# Patient Record
Sex: Female | Born: 1965 | Race: Black or African American | Hispanic: No | Marital: Single | Smoking: Never smoker
Health system: Southern US, Community
[De-identification: ages and names within clinical notes are randomized; demographics above are authoritative.]

## PROBLEM LIST (undated history)

## (undated) DIAGNOSIS — R011 Cardiac murmur, unspecified: Secondary | ICD-10-CM

## (undated) DIAGNOSIS — M549 Dorsalgia, unspecified: Secondary | ICD-10-CM

## (undated) DIAGNOSIS — G8929 Other chronic pain: Secondary | ICD-10-CM

---

## 2006-12-08 ENCOUNTER — Emergency Department (HOSPITAL_COMMUNITY): Admission: EM | Admit: 2006-12-08 | Discharge: 2006-12-08 | Payer: Self-pay | Admitting: Family Medicine

## 2007-01-02 ENCOUNTER — Emergency Department (HOSPITAL_COMMUNITY): Admission: EM | Admit: 2007-01-02 | Discharge: 2007-01-02 | Payer: Self-pay | Admitting: Emergency Medicine

## 2007-03-10 ENCOUNTER — Emergency Department (HOSPITAL_COMMUNITY): Admission: EM | Admit: 2007-03-10 | Discharge: 2007-03-10 | Payer: Self-pay | Admitting: Emergency Medicine

## 2007-03-18 ENCOUNTER — Encounter: Admission: RE | Admit: 2007-03-18 | Discharge: 2007-03-18 | Payer: Self-pay | Admitting: Family Medicine

## 2010-03-10 ENCOUNTER — Encounter: Payer: Self-pay | Admitting: Family Medicine

## 2010-11-26 LAB — POCT URINALYSIS DIP (DEVICE)
Glucose, UA: NEGATIVE
Nitrite: NEGATIVE
Protein, ur: NEGATIVE
Specific Gravity, Urine: 1.02
Urobilinogen, UA: 1
pH: 7

## 2010-11-26 LAB — I-STAT 8, (EC8 V) (CONVERTED LAB)
BUN: 10
Chloride: 104
Glucose, Bld: 128 — ABNORMAL HIGH
Potassium: 3.6
pCO2, Ven: 47.7
pH, Ven: 7.357 — ABNORMAL HIGH

## 2010-11-26 LAB — POCT I-STAT CREATININE
Creatinine, Ser: 0.8
Operator id: 239701

## 2013-06-17 ENCOUNTER — Emergency Department (INDEPENDENT_AMBULATORY_CARE_PROVIDER_SITE_OTHER)
Admission: EM | Admit: 2013-06-17 | Discharge: 2013-06-17 | Disposition: A | Discharge status: Home / Self Care | Payer: Commercial Managed Care - PPO | Source: Home / Self Care

## 2013-06-17 ENCOUNTER — Encounter (HOSPITAL_COMMUNITY): Payer: Self-pay | Admitting: Emergency Medicine

## 2013-06-17 DIAGNOSIS — M791 Myalgia, unspecified site: Secondary | ICD-10-CM

## 2013-06-17 DIAGNOSIS — G8929 Other chronic pain: Secondary | ICD-10-CM

## 2013-06-17 DIAGNOSIS — R52 Pain, unspecified: Secondary | ICD-10-CM

## 2013-06-17 DIAGNOSIS — IMO0001 Reserved for inherently not codable concepts without codable children: Secondary | ICD-10-CM

## 2013-06-17 DIAGNOSIS — T148XXA Other injury of unspecified body region, initial encounter: Secondary | ICD-10-CM

## 2013-06-17 DIAGNOSIS — M549 Dorsalgia, unspecified: Secondary | ICD-10-CM

## 2013-06-17 DIAGNOSIS — M609 Myositis, unspecified: Secondary | ICD-10-CM

## 2013-06-17 DIAGNOSIS — X503XXA Overexertion from repetitive movements, initial encounter: Secondary | ICD-10-CM

## 2013-06-17 HISTORY — DX: Other chronic pain: G89.29

## 2013-06-17 HISTORY — DX: Dorsalgia, unspecified: M54.9

## 2013-06-17 MED ORDER — CYCLOBENZAPRINE HCL 5 MG PO TABS: 5.0000 mg | ORAL_TABLET | Freq: Three times a day (TID) | ORAL | Status: DC | PRN

## 2013-06-17 MED ORDER — KETOROLAC TROMETHAMINE 60 MG/2ML IM SOLN
60.0000 mg | Freq: Once | INTRAMUSCULAR | Status: AC
  Administered 2013-06-17: 60 mg via INTRAMUSCULAR

## 2013-06-17 MED ORDER — TRAMADOL-ACETAMINOPHEN 37.5-325 MG PO TABS: 1.0000 | ORAL_TABLET | Freq: Four times a day (QID) | ORAL | Status: DC | PRN

## 2013-06-17 MED ORDER — MELOXICAM 15 MG PO TABS: 15.0000 mg | ORAL_TABLET | Freq: Every day | ORAL | Status: DC

## 2013-06-17 MED ORDER — KETOROLAC TROMETHAMINE 60 MG/2ML IM SOLN
INTRAMUSCULAR | Status: AC
  Filled 2013-06-17: qty 2

## 2013-06-17 NOTE — ED Notes (Signed)
Encouraged to find pcp

## 2013-06-17 NOTE — ED Notes (Signed)
Patient denies pain and is resting comfortably.  

## 2013-06-17 NOTE — ED Provider Notes (Signed)
CSN: [id number]     Arrival date & time 06/17/13  1145 History   First MD Initiated Contact with Patient 06/17/13 1234     Chief Complaint  Patient presents with  . Back Pain  . Extremity Pain   (Consider location/radiation/quality/duration/timing/severity/associated sxs/prior Treatment) HPI Comments: 48 year old female with chronic low back pain since 2009. 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 For the past 2 weeks she is experiencing worsening muscle pain in the shoulders and upper back as well as the parathoracic muscles. She is experiencing needle and sharp pains in the lower extremities. Her right arm feels tight. Knee the symptoms she has had in the past and was treated by her PCP with physical therapy and medication. She has also been given exercises to perform to help with her back   Past Medical History  Diagnosis Date  . Chronic back pain    History reviewed. No pertinent past surgical history. No family history on file. History  Substance Use Topics  . Smoking status: Never Smoker   . Smokeless tobacco: Not on file  . Alcohol Use: No   OB History   Grav Para Term Preterm Abortions TAB SAB Ect Mult Living                 Review of Systems  Constitutional: Negative for fever, chills and activity change.  HENT: Negative.   Respiratory: Negative.   Cardiovascular: Negative.   Musculoskeletal:       As per HPI  Skin: Negative for color change, pallor and rash.  Neurological: Negative.     Allergies  Reglan  Home Medications   Prior to Admission medications   Not on File   BP 111/68  Pulse 58  Temp(Src) 97.8 F (36.6 C) (Oral)  Resp 12  SpO2 100% Physical Exam  Nursing note and vitals reviewed. Constitutional: She is oriented to person, place, and time. She appears well-developed and well-nourished. No distress.  HENT:  Head: Normocephalic and atraumatic.  Eyes: EOM are normal. Pupils are equal, round, and reactive  to light.  Neck: Normal range of motion. Neck supple.  Cardiovascular: Normal rate and regular rhythm.   Pulmonary/Chest: Effort normal and breath sounds normal. No respiratory distress.  Abdominal: Soft. There is no tenderness.  Musculoskeletal: Normal range of motion. She exhibits no edema.  Tenderness to palpation of the left trapezius ridge, right paracervical musculature, bilateral para thoracic and lumbar musculature, bilateral deltoids and thigh musculature. Strength is 4 over 4 bilaterally she is able to ambulate with some discomfort in the thigh muscles.  Neurological: She is alert and oriented to person, place, and time. No cranial nerve deficit.  Skin: Skin is warm and dry.  Psychiatric: She has a normal mood and affect.    ED Course  Procedures (including critical care time) Labs Review Labs Reviewed - No data to display  Imaging Review No results found.   MDM   1. Myofasciitis   2. Myalgia   3. Repetitive use syndrome   4. Exacerbation of chronic back pain    Flexeril 5 mg ultracet as dir mobic 15mg  Back strethes and heat Obtain MD for chronic and acute problems     Hayden Rasmussenavid Tanner Yeley, NP 06/17/13 1326

## 2013-06-17 NOTE — ED Notes (Signed)
Patient has relocated to [place] from [place], has been in the area for a month.  Ran out of flexeril and hydrocodone about 2 weeks ago.  Patient has chronic back pain, new onset of bilateral arm and leg pain, no known injury

## 2013-06-17 NOTE — ED Notes (Signed)
Family member returned for work note

## 2013-06-17 NOTE — Discharge Instructions (Signed)
Back Pain, Adult Back pain is very common. The pain often gets better over time. The cause of back pain is usually not dangerous. Most people can learn to manage their back pain on their own.  HOME CARE   Stay active. Start with short walks on flat ground if you can. Try to walk farther each day.  Do not sit, drive, or stand in one place for more than 30 minutes. Do not stay in bed.  Do not avoid exercise or work. Activity can help your back heal faster.  Be careful when you bend or lift an object. Bend at your knees, keep the object close to you, and do not twist.  Sleep on a firm mattress. Lie on your side, and bend your knees. If you lie on your back, put a pillow under your knees.  Only take medicines as told by your doctor.  Put ice on the injured area.  Put ice in a plastic bag.  Place a towel between your skin and the bag.  Leave the ice on for 15-20 minutes, 03-04 times a day for the first 2 to 3 days. After that, you can switch between ice and heat packs.  Ask your doctor about back exercises or massage.  Avoid feeling anxious or stressed. Find good ways to deal with stress, such as exercise. GET HELP RIGHT AWAY IF:   Your pain does not go away with rest or medicine.  Your pain does not go away in 1 week.  You have new problems.  You do not feel well.  The pain spreads into your legs.  You cannot control when you poop (bowel movement) or pee (urinate).  Your arms or legs feel weak or lose feeling (numbness).  You feel sick to your stomach (nauseous) or throw up (vomit).  You have belly (abdominal) pain.  You feel like you may pass out (faint). MAKE SURE YOU:   Understand these instructions.  Will watch your condition.  Will get help right away if you are not doing well or get worse. Document Released: 07/22/2007 Document Revised: 04/27/2011 Document Reviewed: 06/23/2010 [facility]Exit[facility] Patient Information 2014 [place]ExitCare, [place].  Repetitive Strain  Injuries Repetitive strain injuries (RSIs) result from overuse or misuse of soft tissues including muscles, tendons, or nerves. Tendons are the cord-like structures that attach muscles to bones. RSIs can affect almost any part of the body. However, RSIs are most common in the arms (thumbs, wrists, elbows, shoulders) and legs (ankles, knees). Common medical conditions that are often caused by repetitive strain include carpal tunnel syndrome, tennis or golfer's elbow, bursitis, and tendonitis. If RSIs are treated early, and therepeated activity is reduced or removed, the severity and length of your problems can usually be reduced. RSIs are also called cumulative trauma disorders (CTD).  CAUSES  Many RSIs occur due to repeating the same activity at work over weeks or months without sufficient rest, such as prolonged typing. RSIs also commonly occur when a hobby or sport is done repeatedly without sufficient rest. RSIs can also occur due to repeated strain or stress on a body part in someone who has one or more risk factors for RSIs. RISK FACTORS Workplace risk factors  Frequent computer use, especially if your workstation is not adjusted for your body type.  Infrequent rest breaks.  Working in a high-pressure environment.  Working at a [employer]fast pace.  Repeating the same motion, such as frequent typing.  Working in an awkward position or holding the same position for a  long time.  Forceful movements such as lifting, pulling, or pushing.  Vibration caused by using power tools.  Working in cold temperatures.  Job stress. Personal risk factors  Poor posture.  Being loose-jointed.  Not exercising regularly.  Being overweight.  Arthritis, diabetes, thyroid problems, or other long-term (chronic)medical conditions.  Vitamin deficiencies.  Keeping your fingernails long.  An unhealthy, stressful, or inactive lifestyle.  Not sleeping well. SYMPTOMS  Symptoms often begin at work but  become more noticeable after the repeated stress has ended. For example, you may develop fatigue or soreness in your wrist while typingat work, and at night you may develop numbness and tingling in your fingers. Common symptoms include:   Burning, shooting, or aching pain, especially in the fingers, palms, wrists, forearms, or shoulders.  Tenderness.  Swelling.  Tingling, numbness, or loss of feeling.  Pain with certain activities, such as turning a doorknob or reaching above your head.  Weakness, heaviness, or loss of coordination in yourhand.  Muscle spasms or tightness. In some cases, symptoms can become so intense that it is difficult to perform everyday tasks. Symptoms that do not improve with rest may indicate a more serious condition.  DIAGNOSIS  Your caregiver may determine the type ofRSI you have based on your medical evaluation and a description of your activities.  TREATMENT  Treatment depends on the severity and type of RSI you have. Your caregiver may recommend rest for the affected body part, medicines, and physical or occupational therapy to reduce pain, swelling, and soreness. Discuss the activities you do repeatedly with your caregiver. Your caregiver can help you decide whether you need to change your activities. An RSI may take months or years to heal, especially if the affected body part gets insufficient rest. In some cases, such as severe carpal tunnel syndrome, surgery may be recommended. PREVENTION  Talk with your supervisor to make sure you have the proper equipment for your work station.  Maintain good posture at your desk or work station with:  Feet flat on the floor.  Knees directly over the feet, bent at a right angle.  Lower back supported by your chair or a cushion in the curve of your lower back.  Shoulders and arms relaxed and at your sides.  Neck relaxed and not bent forwards or backwards.  Your desk and computer workstation properly adjusted  to your body type.  Your chair adjusted so there is no excess pressure on the back of your thighs.  The keyboard resting above your thighs. You should be able to reach the keys with your elbows at your side, bent at a right angle. Your arms should be supported on forearm rests, with your forearms parallel to the ground.  The computer mouse within easy reach.  The monitor directly in front of you, so that your eyes are aligned with the top of the screen. The screen should be about 15 to 25 inches from your eyes.  While typing, keep your wrist straight, in a neutral position. Move your entire arm when you move your mouse or when typing hard-to-reach keys.  Only use your computer as much as you need to for work. Do not use it during breaks.  Take breaks often from any repeated activity. Alternate with another task which requires you to use different muscles, or rest at least once every hour.  Change positions regularly. If you spend a lot of time sitting, get up, walk around, and stretch.  Do not hold pens or pencils  tightly when writing.  Exercise regularly.  Maintain a normal weight.  Eat a diet with plenty of vegetables, whole grains, and fruit.  Get sufficient, restful sleep. HOME CARE INSTRUCTIONS  If your caregiver prescribed medicine to help reduce swelling, take it as directed.  Only take over-the-counter or prescription medicines for pain, discomfort, or fever as directed by your caregiver.  Reduce, and if needed, stopthe activities that are causing your problems until you have no further symptoms.If your symptoms are work-related, you may need to talk to your supervisor about changing your activities.  When symptoms develop, put ice or a cold pack on the aching area.  Put ice in a plastic bag.  Place a towel between your skin and the bag.  Leave the ice on for 15-20 minutes.  If you were given a splint to keep your wrist from bending, wear it as instructed. It is  important to wear the splint at night. Use the splint for as long as your caregiver recommends. SEEK MEDICAL CARE IF:  You develop new problems.  Your problems do not get better with medicine. MAKE SURE YOU:  Understand these instructions.  Will watch your condition.  Will get help right away if you are not doing well or get worse. Document Released: 01/23/2002 Document Revised: 08/04/2011 Document Reviewed: 03/26/2011 [facility]Exit[facility] Patient Information 2014 [place]ExitCare, [place]LLC.  Myofascial Pain Syndrome  Myofascial pain (MP) is one of the most common causes of discomfort. This pain may be felt in the muscles. It may come and go. MP always has trigger or tender points in the muscle that will cause pain when pressed. HOME CARE   Learn to do gentle exercise. Stretch.  Change your position every hour.  Eat a healthy diet.  Find ways to relax and lessen stress.  Avoid things that make your pain worse.  Get a massage by someone trained to release trigger or tender points.  Only take medicine as told by your doctor.  Get follow-up care as needed. GET HELP RIGHT AWAY IF:   Your pain suddenly gets worse.  You are not able to move your arms or legs.  You have pain in your chest.  It is hard to breathe. MAKE SURE YOU:  Understand these instructions.  Will watch your condition.  Will get help right away if you are not doing well or get worse. Document Released: 01/16/2008 Document Revised: 04/27/2011 Document Reviewed: 01/16/2008 [facility]Exit[facility] Patient Information 2014 [place]ExitCare, [place]LLC.

## 2013-06-20 NOTE — ED Provider Notes (Signed)
Medical screening examination/treatment/procedure(s) were performed by resident physician or non-physician practitioner and as supervising physician I was immediately available for consultation/collaboration.   Adelyne Marchese DOUGLAS MD.   Tevon Berhane D Jaimi Belle, MD 06/20/13 0809 

## 2013-06-28 ENCOUNTER — Encounter (HOSPITAL_COMMUNITY): Payer: Self-pay | Admitting: Emergency Medicine

## 2013-06-28 ENCOUNTER — Emergency Department (HOSPITAL_COMMUNITY): Payer: Commercial Managed Care - PPO

## 2013-06-28 ENCOUNTER — Emergency Department (HOSPITAL_COMMUNITY)
Admission: EM | Admit: 2013-06-28 | Discharge: 2013-06-29 | Disposition: A | Discharge status: Home / Self Care | Payer: Commercial Managed Care - PPO | Attending: Emergency Medicine | Admitting: Emergency Medicine

## 2013-06-28 DIAGNOSIS — M25569 Pain in unspecified knee: Secondary | ICD-10-CM | POA: Insufficient documentation

## 2013-06-28 DIAGNOSIS — G8929 Other chronic pain: Secondary | ICD-10-CM | POA: Insufficient documentation

## 2013-06-28 DIAGNOSIS — M25562 Pain in left knee: Secondary | ICD-10-CM

## 2013-06-28 DIAGNOSIS — R011 Cardiac murmur, unspecified: Secondary | ICD-10-CM | POA: Insufficient documentation

## 2013-06-28 HISTORY — DX: Cardiac murmur, unspecified: R01.1

## 2013-06-28 MED ORDER — HYDROCODONE-ACETAMINOPHEN 5-325 MG PO TABS: ORAL_TABLET | ORAL | Status: DC

## 2013-06-28 MED ORDER — HYDROCODONE-ACETAMINOPHEN 5-325 MG PO TABS
2.0000 | ORAL_TABLET | Freq: Once | ORAL | Status: AC
  Administered 2013-06-28: 2 via ORAL
  Filled 2013-06-28: qty 2

## 2013-06-28 NOTE — ED Provider Notes (Signed)
CSN: [id number]     Arrival date & time 06/28/13  2106 History   First MD Initiated Contact with Patient 06/28/13 2205     Chief Complaint  Patient presents with  . Knee Pain     (Consider location/radiation/quality/duration/timing/severity/associated sxs/prior Treatment) HPI  Tara Roach is a 48 y.o. female complaining of bilateral knee pain, significantly worse on the left over the course of 2 weeks. Patient has taken over-the-counter pain medication with little relief. This is atraumatic however she states that when she weight-bear she did feel a popping sensation in the left knee she feels as if the bones are moving. 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 Patient denies numbness, weakness, paresthesia, calf pain, fever.   Past Medical History  Diagnosis Date  . Chronic back pain   . Heart murmur    Past Surgical History  Procedure Laterality Date  . Cesarean section     Family History  Problem Relation Age of Onset  . Cancer Father   . Hypertension Sister   . Diabetes Brother    History  Substance Use Topics  . Smoking status: Never Smoker   . Smokeless tobacco: Not on file  . Alcohol Use: No   OB History   Grav Para Term Preterm Abortions TAB SAB Ect Mult Living                 Review of Systems  10 systems reviewed and found to be negative, except as noted in the HPI.   Allergies  Reglan  Home Medications   Prior to Admission medications   Medication Sig Start Date End Date Taking? Authorizing Provider  naproxen sodium (ANAPROX) 220 MG tablet Take 440 mg by mouth every 6 (six) hours as needed (pain).   Yes Historical Provider, MD  traMADol-acetaminophen (ULTRACET) 37.5-325 MG per tablet Take 1-2 tablets by mouth every 6 (six) hours as needed. 06/17/13  Yes Hayden Rasmussenavid Mabe, NP   BP 131/73  Pulse 52  Temp(Src) 98 F (36.7 C) (Oral)  Resp 20  Ht 5\' 2"  (1.575 m)  Wt 178 lb (80.74 kg)  BMI 32.55 kg/m2  SpO2 100%   LMP 06/11/2013 Physical Exam  Nursing note and vitals reviewed. Constitutional: She is oriented to person, place, and time. She appears well-developed and well-nourished. No distress.  HENT:  Head: Normocephalic.  Eyes: Conjunctivae and EOM are normal.  Cardiovascular: Normal rate.   Pulmonary/Chest: Effort normal and breath sounds normal. No stridor. No respiratory distress. She has no wheezes. She has no rales. She exhibits no tenderness.  Abdominal: Soft. Bowel sounds are normal. She exhibits no distension and no mass. There is no tenderness. There is no rebound and no guarding.  Musculoskeletal: Normal range of motion. She exhibits no edema and no tenderness.  Bilateral knees:  No deformity, erythema or abrasions. FROM. No effusion or crepitance. Anterior and posterior drawer show no abnormal laxity. Stable to valgus and varus stress. Joint lines are non-tender. Neurovascularly intact. Pt ambulates with mildly antalgic gait.    Neurological: She is alert and oriented to person, place, and time.  Psychiatric: She has a normal mood and affect.    ED Course  Procedures (including critical care time) Labs Review Labs Reviewed - No data to display  Imaging Review Dg Knee Complete 4 Views Left  06/28/2013   CLINICAL DATA:  Pain.  No known injury.  EXAM: LEFT KNEE - COMPLETE 4+ VIEW  COMPARISON:  None.  FINDINGS: No acute fracture or dislocation. Small joint effusion present within the suprapatellar recess. Joint spaces are well maintained. No significant degenerative changes seen about the knee. Osseous mineralization is normal. No soft tissue abnormality.  IMPRESSION: 1. Small joint effusion within the suprapatellar recess. 2. No acute fracture or dislocation.   Electronically Signed   By: Rise MuBenjamin  McClintock M.D.   On: 06/28/2013 22:56     EKG Interpretation None      MDM   Final diagnoses:  Arthralgia of left knee    Filed Vitals:   06/28/13 2159 06/28/13 2347  BP:  131/58 131/73  Pulse: 52   Temp: 98 F (36.7 C) 98 F (36.7 C)  TempSrc: Oral Oral  Resp: 20 20  Height: 5\' 2"  (1.575 m)   Weight: 178 lb (80.74 kg)   SpO2: 99% 100%    Medications  HYDROcodone-acetaminophen (NORCO/VICODIN) 5-325 MG per tablet 2 tablet (2 tablets Oral Given 06/28/13 2345)    Tara Roach is a 48 y.o. female presenting with bilateral knee pain, worse on the left than the right. This is atraumatic, she feels that the left knee is unstable. Physical exam shows no abnormal laxity. X-ray negative. I will place the patient in a knee immobilizer, give her pain control and encourage rest. I have advised her that it is very important that she establish primary care. Given her wellness center referral.  Evaluation does not show pathology that would require ongoing emergent intervention or inpatient treatment. Pt is hemodynamically stable and mentating appropriately. Discussed findings and plan with patient/guardian, who agrees with care plan. All questions answered. Return precautions discussed and outpatient follow up given.   Discharge Medication List as of 06/28/2013 11:12 PM    START taking these medications   Details  HYDROcodone-acetaminophen (NORCO/VICODIN) 5-325 MG per tablet Take 1-2 tablets by mouth every 6 hours as needed for pain., Print        Note: Portions of this report may have been transcribed using voice recognition software. Every effort was made to ensure accuracy; however, inadvertent computerized transcription errors may be present     Wynetta Emeryicole Julie Nay, PA-C 06/29/13 0139

## 2013-06-28 NOTE — Discharge Instructions (Signed)
Rest, Ice intermittently (in the first 24-48 hours), Gentle compression with an Ace wrap, and elevate (Limb above the level of the heart)   Take up to 800mg  of ibuprofen (that is usually 4 over the counter pills)  3 times a day for 5 days. Take with food.  Take vicodin for breakthrough pain, do not drink alcohol, drive, care for children or do other critical tasks while taking vicodin.   Do not hesitate to return to the Emergency Department for any new, worsening or concerning symptoms.   If you do not have a primary care doctor you can establish one at the   [facility] [address] [place] [id number] [phone]  After you establish care. Let them know you were seen in the emergency room. They must obtain records for further management.    Knee Pain Knee pain can be a result of an injury or other medical conditions. Treatment will depend on the cause of your pain. HOME CARE  Only take medicine as told by your doctor.  Keep a healthy weight. Being overweight can make the knee hurt more.  Stretch before exercising or playing sports.  If there is constant knee pain, change the way you exercise. Ask your doctor for advice.  Make sure shoes fit well. Choose the right shoe for the sport or activity.  Protect your knees. Wear kneepads if needed.  Rest when you are tired. GET HELP RIGHT AWAY IF:   Your knee pain does not stop.  Your knee pain does not get better.  Your knee joint feels hot to the touch.  You have a fever. MAKE SURE YOU:   Understand these instructions.  Will watch this condition.  Will get help right away if you are not doing well or get worse. Document Released: 05/01/2008 Document Revised: 04/27/2011 Document Reviewed: 05/01/2008 [facility]Exit[facility] Patient Information 2014 [place]ExitCare, [place]LL[place].

## 2013-06-28 NOTE — ED Notes (Signed)
Pt is c/o bilateral knee pain and pain in her left leg that radiates straight down to her foot and up to her hip   Pt states she has been hurting for 2 weeks  Pt states her left knee keeps "popping"

## 2013-07-01 NOTE — ED Provider Notes (Signed)
Medical screening examination/treatment/procedure(s) were performed by non-physician practitioner and as supervising physician I was immediately available for consultation/collaboration.    Signa Cheek R Jameya Pontiff, MD 07/01/13 0710 

## 2013-08-05 ENCOUNTER — Encounter (HOSPITAL_COMMUNITY): Payer: Self-pay | Admitting: Emergency Medicine

## 2013-08-05 ENCOUNTER — Emergency Department (INDEPENDENT_AMBULATORY_CARE_PROVIDER_SITE_OTHER)
Admission: EM | Admit: 2013-08-05 | Discharge: 2013-08-05 | Disposition: A | Discharge status: Home / Self Care | Payer: Commercial Managed Care - PPO | Source: Home / Self Care | Attending: Family Medicine | Admitting: Family Medicine

## 2013-08-05 DIAGNOSIS — S43499A Other sprain of unspecified shoulder joint, initial encounter: Secondary | ICD-10-CM

## 2013-08-05 DIAGNOSIS — G43009 Migraine without aura, not intractable, without status migrainosus: Secondary | ICD-10-CM

## 2013-08-05 DIAGNOSIS — S46812A Strain of other muscles, fascia and tendons at shoulder and upper arm level, left arm, initial encounter: Secondary | ICD-10-CM

## 2013-08-05 DIAGNOSIS — X58XXXA Exposure to other specified factors, initial encounter: Secondary | ICD-10-CM

## 2013-08-05 DIAGNOSIS — S46819A Strain of other muscles, fascia and tendons at shoulder and upper arm level, unspecified arm, initial encounter: Secondary | ICD-10-CM

## 2013-08-05 LAB — POCT PREGNANCY, URINE: PREG TEST UR: NEGATIVE

## 2013-08-05 MED ORDER — DEXAMETHASONE SODIUM PHOSPHATE 10 MG/ML IJ SOLN
10.0000 mg | Freq: Once | INTRAMUSCULAR | Status: AC
  Administered 2013-08-05: 10 mg via INTRAMUSCULAR

## 2013-08-05 MED ORDER — DEXAMETHASONE SODIUM PHOSPHATE 10 MG/ML IJ SOLN
INTRAMUSCULAR | Status: AC
  Filled 2013-08-05: qty 1

## 2013-08-05 MED ORDER — KETOROLAC TROMETHAMINE 60 MG/2ML IM SOLN
INTRAMUSCULAR | Status: AC
  Filled 2013-08-05: qty 2

## 2013-08-05 MED ORDER — KETOROLAC TROMETHAMINE 60 MG/2ML IM SOLN
60.0000 mg | Freq: Once | INTRAMUSCULAR | Status: AC
  Administered 2013-08-05: 60 mg via INTRAMUSCULAR

## 2013-08-05 NOTE — Discharge Instructions (Signed)
Thank you for coming in today. Continue Tylenol ibuprofen or Aleve for headache.  Attend physical therapy for shoulder pain. You should be contacted by physical therapy soon. If you do not hear from them you can call [address] [place], [place] [phone] Please followup with a primary care Dr. Nyra CapesGo to the emergency room if your headache becomes excruciating or you have weakness or numbness or uncontrolled vomiting.  Come back or go to the emergency room if you notice new weakness new numbness problems walking or bowel or bladder problems.  PRIMARY CARE Merchant navy officerDOCTORS [facility] [address]  [place], [place] [phone]  Fax [phone]  Nature conservation [facility] at [facility] [address]  [place], [place] [phone]  Fax [phone]  Nature conservation officer[facility] at [place] / [place] [phone]  [place], [place] [phone]  Fax [phone]  [facility] [address], [address]  [place], [place] [phone]  Fax [phone]  [facility] [address] [place] [address]  [place], [place] [phone]  Fax [phone]  [facility] [address] [place]  [place], [place] [phone]  Fax [phone]   [facility] [place] [address] [place] [address] Phone: [phone]   [facility] @ [facility] [address] [place] [address] Phone: [phone]   [facility] @ [place] [address] [place] [address] Phone: [phone]   [facility] @ [place] [address] [place] [address] Phone: [phone]   [facility] @ [place] [address] [place] [address] Phone: [phone] Fax: [phone]   [facility] @ [place]  [place] [address] [place] [address] Phone: [phone]     Migraine Headache A migraine headache is an intense, throbbing pain on one or both sides of your head. A migraine can last for 30 minutes to several hours. CAUSES  The exact cause of a migraine headache is not always known. However, a migraine may be caused when nerves in the brain become irritated and release chemicals that cause inflammation. This causes pain. Certain things may also trigger migraines, such as:  Alcohol.  Smoking.  Stress.  Menstruation.  Aged cheeses.  Foods or drinks that contain nitrates, glutamate, aspartame, or tyramine.  Lack of sleep.  Chocolate.  Caffeine.  Hunger.  Physical exertion.  Fatigue.  Medicines used to treat chest pain (nitroglycerine), birth control pills, estrogen, and some blood pressure medicines. SIGNS AND SYMPTOMS  Pain on one or both sides of your head.  Pulsating or throbbing pain.  Severe pain that prevents daily activities.  Pain that is aggravated by any physical activity.  Nausea, vomiting, or both.  Dizziness.  Pain with exposure to bright lights, loud noises, or activity.  General sensitivity to bright lights, loud noises, or smells. Before you get a migraine, you may get warning signs that a migraine is coming (aura). An aura may include:  Seeing flashing lights.  Seeing bright spots, halos, or zig-zag lines.  Having tunnel vision or blurred vision.  Having feelings of numbness or tingling.  Having trouble talking.  Having muscle weakness. DIAGNOSIS  A migraine headache is often diagnosed based on:  Symptoms.  Physical exam.  A  CT scan or MRI of your head. These imaging tests cannot diagnose migraines, but they can help rule out other causes of headaches. TREATMENT Medicines may be given for pain and nausea. Medicines can also be given to help prevent recurrent migraines.  HOME CARE INSTRUCTIONS  Only take  over-the-counter or prescription medicines for pain or discomfort as directed by your health care provider. The use of long-term narcotics is not recommended.  Lie down in a dark, quiet room when you have a migraine.  Keep a journal to find out what may trigger your migraine headaches. For example, write down:  What you eat and drink.  How much sleep you get.  Any change to your diet or medicines.  Limit alcohol consumption.  Quit smoking if you smoke.  Get 7-9 hours of sleep, or as recommended by your health care provider.  Limit stress.  Keep lights dim if bright lights bother you and make your migraines worse. SEEK IMMEDIATE MEDICAL CARE IF:   Your migraine becomes severe.  You have a fever.  You have a stiff neck.  You have vision loss.  You have muscular weakness or loss of muscle control.  You start losing your balance or have trouble walking.  You feel faint or pass out.  You have severe symptoms that are different from your first symptoms. MAKE SURE YOU:   Understand these instructions.  Will watch your condition.  Will get help right away if you are not doing well or get worse. Document Released: 02/02/2005 Document Revised: 11/23/2012 Document Reviewed: 10/10/2012 [facility] Patient Information 2015 [employer], [place]. This information is not intended to replace advice given to you by your health care provider. Make sure you discuss any questions you have with your health care provider.

## 2013-08-05 NOTE — ED Notes (Signed)
Patient complains of headache with upper back pain and eye pain around left eye. States some blurry vision and nausea.

## 2013-08-05 NOTE — ED Provider Notes (Signed)
Tara Roach is a 48 y.o. female who presents to Urgent Care today for headache and trapezius pain.  1) patient has developed a headache for the last 4 days. She describes a pounding left-sided headache associated with photophobia and mild occasional blurry vision. She additionally notes nausea. She feels fine currently aside from mild headache. She's tried Tylenol which helps some. She denies any fevers or chills nausea vomiting or diarrhea. She notes that she has a history of migraines and her headache is consistent with prior episodes of migraines. She denies any weakness or numbness dizziness lightheadedness or loss of function.  2) trapezius pain patient notes pain in the left trapezius. This is been ongoing for several months. 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 She denies any significant radiating pain weakness or numbness. The pain is worse with activity and better with rest. She notes Tylenol has helped a little. She feels well otherwise.   Past Medical History  Diagnosis Date  . Chronic back pain   . Heart murmur    History  Substance Use Topics  . Smoking status: Never Smoker   . Smokeless tobacco: Not on file  . Alcohol Use: No   ROS as above Medications: No current facility-administered medications for this encounter.   No current outpatient prescriptions on file.    Exam:  BP 115/74  Pulse 60  Temp(Src) 98.4 F (36.9 C) (Oral)  Resp 16  SpO2 100%  LMP 07/15/2013 Gen: Well NAD HEENT: EOMI,  MMM. PERRLA bilateral. Funduscopic exam is normal bilaterally. Visual acuity shows 20/20 of the right eye in the left eye and 20/15 bilaterally Lungs: Normal work of breathing. CTABL Heart: RRR no MRG Abd: NABS, Soft. NT, ND Exts: Brisk capillary refill, warm and well perfused.  Neuro: Alert and oriented. Cranial nerves 2-12 are intact. Strength is intact throughout. Sensation is intact throughout. Reflexes are intact bilateral upper and lower  extremities. Normal balance gait and coordination.  Patient was given a intramuscular injection of Toradol and dexamethasone prior to discharge.   Results for orders placed during the hospital encounter of 08/05/13 (from the past 24 hour(s))  POCT PREGNANCY, URINE     Status: None   Collection Time    08/05/13  9:52 AM      Result Value Ref Range   Preg Test, Ur NEGATIVE  NEGATIVE   No results found.  Assessment and Plan: 48 y.o. female with  1) headache. Appears to be migraine type. Treatment with IM Toradol and dexamethasone. 2) trapezius pain: Chronic. Refer to physical therapy. Followup with primary care Dr.  Discussed warning signs or symptoms. Please see discharge instructions. Patient expresses understanding.    Rodolph BongEvan S Lynzee Lindquist, MD 08/05/13 1048

## 2013-10-17 ENCOUNTER — Ambulatory Visit: Payer: Commercial Managed Care - PPO | Admitting: Physical Therapy

## 2013-10-17 ENCOUNTER — Ambulatory Visit: Payer: Commercial Managed Care - PPO | Attending: Orthopaedic Surgery | Admitting: Physical Therapy

## 2013-10-17 DIAGNOSIS — M542 Cervicalgia: Secondary | ICD-10-CM | POA: Diagnosis not present

## 2013-10-17 DIAGNOSIS — IMO0001 Reserved for inherently not codable concepts without codable children: Secondary | ICD-10-CM | POA: Insufficient documentation

## 2013-10-17 DIAGNOSIS — M25569 Pain in unspecified knee: Secondary | ICD-10-CM | POA: Insufficient documentation

## 2013-10-19 ENCOUNTER — Other Ambulatory Visit: Payer: Self-pay | Admitting: Orthopaedic Surgery

## 2013-10-19 DIAGNOSIS — S83241S Other tear of medial meniscus, current injury, right knee, sequela: Secondary | ICD-10-CM

## 2013-10-30 ENCOUNTER — Encounter: Payer: Commercial Managed Care - PPO | Admitting: Rehabilitation

## 2013-11-02 ENCOUNTER — Encounter: Payer: Commercial Managed Care - PPO | Admitting: Rehabilitation

## 2013-11-04 ENCOUNTER — Ambulatory Visit
Admission: RE | Admit: 2013-11-04 | Discharge: 2013-11-04 | Disposition: A | Discharge status: Home / Self Care | Payer: Commercial Managed Care - PPO | Source: Ambulatory Visit | Attending: Orthopaedic Surgery | Admitting: Orthopaedic Surgery

## 2013-11-04 ENCOUNTER — Encounter (INDEPENDENT_AMBULATORY_CARE_PROVIDER_SITE_OTHER): Payer: Self-pay

## 2013-11-04 DIAGNOSIS — S83241S Other tear of medial meniscus, current injury, right knee, sequela: Secondary | ICD-10-CM

## 2013-11-06 ENCOUNTER — Encounter: Payer: Commercial Managed Care - PPO | Admitting: Rehabilitation

## 2013-11-09 ENCOUNTER — Encounter: Payer: Commercial Managed Care - PPO | Admitting: Rehabilitation

## 2013-11-26 ENCOUNTER — Encounter (HOSPITAL_COMMUNITY): Payer: Self-pay | Admitting: Emergency Medicine

## 2013-11-26 ENCOUNTER — Emergency Department (HOSPITAL_COMMUNITY)
Admission: EM | Admit: 2013-11-26 | Discharge: 2013-11-26 | Disposition: A | Discharge status: Home / Self Care | Payer: Commercial Managed Care - PPO | Attending: Emergency Medicine | Admitting: Emergency Medicine

## 2013-11-26 DIAGNOSIS — Z3202 Encounter for pregnancy test, result negative: Secondary | ICD-10-CM | POA: Insufficient documentation

## 2013-11-26 DIAGNOSIS — R51 Headache: Secondary | ICD-10-CM | POA: Diagnosis not present

## 2013-11-26 DIAGNOSIS — R011 Cardiac murmur, unspecified: Secondary | ICD-10-CM | POA: Insufficient documentation

## 2013-11-26 DIAGNOSIS — G8929 Other chronic pain: Secondary | ICD-10-CM | POA: Insufficient documentation

## 2013-11-26 DIAGNOSIS — R112 Nausea with vomiting, unspecified: Secondary | ICD-10-CM | POA: Insufficient documentation

## 2013-11-26 DIAGNOSIS — R42 Dizziness and giddiness: Secondary | ICD-10-CM | POA: Diagnosis present

## 2013-11-26 DIAGNOSIS — R519 Headache, unspecified: Secondary | ICD-10-CM

## 2013-11-26 LAB — COMPREHENSIVE METABOLIC PANEL
ALT: 14 U/L (ref 0–35)
ANION GAP: 13 (ref 5–15)
AST: 24 U/L (ref 0–37)
Albumin: 3.9 g/dL (ref 3.5–5.2)
Alkaline Phosphatase: 56 U/L (ref 39–117)
BUN: 10 mg/dL (ref 6–23)
CO2: 21 meq/L (ref 19–32)
CREATININE: 0.58 mg/dL (ref 0.50–1.10)
Calcium: 9.3 mg/dL (ref 8.4–10.5)
Chloride: 104 mEq/L (ref 96–112)
Glucose, Bld: 105 mg/dL — ABNORMAL HIGH (ref 70–99)
Potassium: 5.4 mEq/L — ABNORMAL HIGH (ref 3.7–5.3)
Sodium: 138 mEq/L (ref 137–147)
Total Bilirubin: 0.4 mg/dL (ref 0.3–1.2)
Total Protein: 8 g/dL (ref 6.0–8.3)

## 2013-11-26 LAB — URINE MICROSCOPIC-ADD ON

## 2013-11-26 LAB — CBC WITH DIFFERENTIAL/PLATELET
Basophils Absolute: 0 10*3/uL (ref 0.0–0.1)
Basophils Relative: 0 % (ref 0–1)
EOS PCT: 1 % (ref 0–5)
Eosinophils Absolute: 0.1 10*3/uL (ref 0.0–0.7)
HEMATOCRIT: 44.3 % (ref 36.0–46.0)
Hemoglobin: 14.9 g/dL (ref 12.0–15.0)
LYMPHS PCT: 34 % (ref 12–46)
Lymphs Abs: 2.8 10*3/uL (ref 0.7–4.0)
MCH: 30 pg (ref 26.0–34.0)
MCHC: 33.6 g/dL (ref 30.0–36.0)
MCV: 89.1 fL (ref 78.0–100.0)
MONO ABS: 0.7 10*3/uL (ref 0.1–1.0)
MONOS PCT: 8 % (ref 3–12)
NEUTROS ABS: 4.7 10*3/uL (ref 1.7–7.7)
Neutrophils Relative %: 57 % (ref 43–77)
Platelets: 256 10*3/uL (ref 150–400)
RBC: 4.97 MIL/uL (ref 3.87–5.11)
RDW: 13.8 % (ref 11.5–15.5)
WBC: 8.3 10*3/uL (ref 4.0–10.5)

## 2013-11-26 LAB — URINALYSIS, ROUTINE W REFLEX MICROSCOPIC
Bilirubin Urine: NEGATIVE
GLUCOSE, UA: NEGATIVE mg/dL
KETONES UR: NEGATIVE mg/dL
Nitrite: NEGATIVE
PROTEIN: NEGATIVE mg/dL
Specific Gravity, Urine: 1.011 (ref 1.005–1.030)
UROBILINOGEN UA: 1 mg/dL (ref 0.0–1.0)
pH: 6.5 (ref 5.0–8.0)

## 2013-11-26 LAB — I-STAT TROPONIN, ED: Troponin i, poc: 0 ng/mL (ref 0.00–0.08)

## 2013-11-26 LAB — PREGNANCY, URINE: Preg Test, Ur: NEGATIVE

## 2013-11-26 MED ORDER — PROCHLORPERAZINE EDISYLATE 5 MG/ML IJ SOLN
10.0000 mg | Freq: Once | INTRAMUSCULAR | Status: AC
  Administered 2013-11-26: 10 mg via INTRAVENOUS
  Filled 2013-11-26: qty 2

## 2013-11-26 MED ORDER — DIPHENHYDRAMINE HCL 50 MG/ML IJ SOLN
25.0000 mg | Freq: Once | INTRAMUSCULAR | Status: AC
  Administered 2013-11-26: 25 mg via INTRAVENOUS
  Filled 2013-11-26: qty 1

## 2013-11-26 MED ORDER — SODIUM CHLORIDE 0.9 % IV BOLUS (SEPSIS)
1000.0000 mL | Freq: Once | INTRAVENOUS | Status: AC
  Administered 2013-11-26: 1000 mL via INTRAVENOUS

## 2013-11-26 MED ORDER — KETOROLAC TROMETHAMINE 30 MG/ML IJ SOLN
30.0000 mg | Freq: Once | INTRAMUSCULAR | Status: AC
  Administered 2013-11-26: 30 mg via INTRAVENOUS
  Filled 2013-11-26: qty 1

## 2013-11-26 NOTE — ED Notes (Signed)
Pt states she has been having upper back and neck spasm since Friday.  Pt states she has also been dizzy and has been nauseated.  Unknown for fever.  Has been going to work and not feeling well during the day.

## 2013-11-26 NOTE — ED Notes (Signed)
Patient refused to stand up to finish orthostatic vital signs, patient also refused to provide a urine sample. Patient stated she "just wanted to relax".

## 2013-11-26 NOTE — Discharge Instructions (Signed)
General Headache Without Cause °A headache is pain or discomfort felt around the head or neck area. The specific cause of a headache may not be found. There are many causes and types of headaches. A few common ones are: °· Tension headaches. °· Migraine headaches. °· Cluster headaches. °· Chronic daily headaches. °HOME CARE INSTRUCTIONS  °· Keep all follow-up appointments with your caregiver or any specialist referral. °· Only take over-the-counter or prescription medicines for pain or discomfort as directed by your caregiver. °· Lie down in a dark, quiet room when you have a headache. °· Keep a headache journal to find out what may trigger your migraine headaches. For example, write down: °· What you eat and drink. °· How much sleep you get. °· Any change to your diet or medicines. °· Try massage or other relaxation techniques. °· Put ice packs or heat on the head and neck. Use these 3 to 4 times per day for 15 to 20 minutes each time, or as needed. °· Limit stress. °· Sit up straight, and do not tense your muscles. °· Quit smoking if you smoke. °· Limit alcohol use. °· Decrease the amount of caffeine you drink, or stop drinking caffeine. °· Eat and sleep on a regular schedule. °· Get 7 to 9 hours of sleep, or as recommended by your caregiver. °· Keep lights dim if bright lights bother you and make your headaches worse. °SEEK MEDICAL CARE IF:  °· You have problems with the medicines you were prescribed. °· Your medicines are not working. °· You have a change from the usual headache. °· You have nausea or vomiting. °SEEK IMMEDIATE MEDICAL CARE IF:  °· Your headache becomes severe. °· You have a fever. °· You have a stiff neck. °· You have loss of vision. °· You have muscular weakness or loss of muscle control. °· You start losing your balance or have trouble walking. °· You feel faint or pass out. °· You have severe symptoms that are different from your first symptoms. °MAKE SURE YOU:  °· Understand these  instructions. °· Will watch your condition. °· Will get help right away if you are not doing well or get worse. °Document Released: 02/02/2005 Document Revised: 04/27/2011 Document Reviewed: 02/18/2011 °ExitCare® Patient Information ©2015 [employer]. This information is not intended to replace advice given to you by your health care provider. Make sure you discuss any questions you have with your health care provider. ° °Migraine Headache °A migraine headache is an intense, throbbing pain on one or both sides of your head. A migraine can last for 30 minutes to several hours. °CAUSES  °The exact cause of a migraine headache is not always known. However, a migraine may be caused when nerves in the brain become irritated and release chemicals that cause inflammation. This causes pain. °Certain things may also trigger migraines, such as: °· Alcohol. °· Smoking. °· Stress. °· Menstruation. °· Aged cheeses. °· Foods or drinks that contain nitrates, glutamate, aspartame, or tyramine. °· Lack of sleep. °· Chocolate. °· Caffeine. °· Hunger. °· Physical exertion. °· Fatigue. °· Medicines used to treat chest pain (nitroglycerine), birth control pills, estrogen, and some blood pressure medicines. °SIGNS AND SYMPTOMS °· Pain on one or both sides of your head. °· Pulsating or throbbing pain. °· Severe pain that prevents daily activities. °· Pain that is aggravated by any physical activity. °· Nausea, vomiting, or both. °· Dizziness. °· Pain with exposure to bright lights, loud noises, or activity. °· General sensitivity   to bright lights, loud noises, or smells. °Before you get a migraine, you may get warning signs that a migraine is coming (aura). An aura may include: °· Seeing flashing lights. °· Seeing bright spots, halos, or zigzag lines. °· Having tunnel vision or blurred vision. °· Having feelings of numbness or tingling. °· Having trouble talking. °· Having muscle weakness. °DIAGNOSIS  °A migraine headache is often  diagnosed based on: °· Symptoms. °· Physical exam. °· A CT scan or MRI of your head. These imaging tests cannot diagnose migraines, but they can help rule out other causes of headaches. °TREATMENT °Medicines may be given for pain and nausea. Medicines can also be given to help prevent recurrent migraines.  °HOME CARE INSTRUCTIONS °· Only take over-the-counter or prescription medicines for pain or discomfort as directed by your health care provider. The use of long-term narcotics is not recommended. °· Lie down in a dark, quiet room when you have a migraine. °· Keep a journal to find out what may trigger your migraine headaches. For example, write down: °¨ What you eat and drink. °¨ How much sleep you get. °¨ Any change to your diet or medicines. °· Limit alcohol consumption. °· Quit smoking if you smoke. °· Get 7-9 hours of sleep, or as recommended by your health care provider. °· Limit stress. °· Keep lights dim if bright lights bother you and make your migraines worse. °SEEK IMMEDIATE MEDICAL CARE IF:  °· Your migraine becomes severe. °· You have a fever. °· You have a stiff neck. °· You have vision loss. °· You have muscular weakness or loss of muscle control. °· You start losing your balance or have trouble walking. °· You feel faint or pass out. °· You have severe symptoms that are different from your first symptoms. °MAKE SURE YOU:  °· Understand these instructions. °· Will watch your condition. °· Will get help right away if you are not doing well or get worse. °Document Released: 02/02/2005 Document Revised: 06/19/2013 Document Reviewed: 10/10/2012 °ExitCare® Patient Information ©2015 [employer]. This information is not intended to replace advice given to you by your health care provider. Make sure you discuss any questions you have with your health care provider. ° ° °Emergency Department Resource Guide °1) Find a Doctor and Pay Out of Pocket °Although you won't have to find out who is covered by your  insurance plan, it is a good idea to ask around and get recommendations. You will then need to call the office and see if the doctor you have chosen will accept you as a new patient and what types of options they offer for patients who are self-pay. Some doctors offer discounts or will set up payment plans for their patients who do not have insurance, but you will need to ask so you aren't surprised when you get to your appointment. ° °2) Contact Your Local Health Department °Not all health departments have doctors that can see patients for sick visits, but many do, so it is worth a call to see if yours does. If you don't know where your local health department is, you can check in your phone book. The CDC also has a tool to help you locate your state's health department, and many state websites also have listings of all of their local health departments. ° °3) Find a Walk-in Clinic °If your illness is not likely to be very severe or complicated, you may want to try a walk in clinic. These are popping up   all over the country in pharmacies, drugstores, and shopping centers. They're usually staffed by nurse practitioners or physician assistants that have been trained to treat common illnesses and complaints. They're usually fairly quick and inexpensive. However, if you have serious medical issues or chronic medical problems, these are probably not your best option. ° °No Primary Care Doctor: °- Call [facility] at  832-8000 - they can help you locate a primary care doctor that  accepts your insurance, provides certain services, etc. °- Physician Referral Service- 1-800-533-3463 ° °Chronic Pain Problems: °Organization         Address  Phone   Notes  °[facility]  (336) 297-2271 Patients need to be referred by their primary care doctor.  ° °Medication Assistance: °Organization         Address  Phone   Notes  °[place] [facility] [address] °[place], [place] [address] (336) 641-8030 --Must be a resident of [place] °-- Must have NO insurance coverage whatsoever (no Medicaid/ Medicare, etc.) °-- The pt. MUST have a primary care doctor that directs their care regularly and follows them in the community °  °[facility]  (866) 331-1348   °[employer]  (888) 892-1162   ° °Agencies that provide inexpensive medical care: °Organization         Address  Phone   Notes  °[facility]  (336) 832-8035   °[facility]    (336) 832-7272   °[facility] [address] °[place], [place] [address] (336) 832-4777   °[facility] [address], °[place] (336) 271-4999   °[facility]    (336) 373-0678   °[facility]    (336) 272-1050   °[facility] ° [address], [place] Phone:  (336) 832-4444, Fax:  (336) 832-4440 Hours of Operation:  9 am - 6 pm, M-F.  Also accepts Medicaid/Medicare and self-pay.  °[facility] ° [address], [place] Phone: (336) [phone], Fax: (336) [phone]. Hours of Operation:  8:30 am - 5:30 pm, M-F.  Also accepts Medicaid and self-pay.  °[facility] [place] [address], [place] Phone: (336) 878-6027   °[facility] [address], [place], [place] (336)723-1848, Ext. 123 Mondays & Thursdays: 7-9 AM.  First 15 patients are seen on a first come, first serve basis. °  ° °Medicaid-accepting [place] Providers: ° °Organization         Address  Phone   Notes  °[facility] [address], [place] (336) 641-2100 Also accepts self-pay patients.  °[facility] [address], [place] ° (336) 856-9996   °[facility] [address], [place] (336) 288-8857   °[facility] [address] [place] [address], [place] (336) 299-7000   °Veita Bland [address], [place]  ° (336) [phone] Only accepts [place] Access Medicaid patients after they have their name applied to their card.  ° °Self-Pay (no insurance) in [place]: ° °Organization         Address  Phone   Notes  °Sickle Cell Patients, [facility] [address], [place] (336) 832-1970   °[facility] [address], [place] (336) 832-4400   °[facility] [place] ° [address], [place] (336) 992-4800   °[facility]/Dr. Osei-Bonsu ° [address] [place] [address], [place] or [address], [place] (336) [phone] Phone number for both [place] and [place] locations is the same.  °[facility] [address], [place] (336) 299-0000   °[facility] [address] [place] [address], [place] or [address] (336) 852-7530 °(336) 878-2260   °[facility] [address], [place] (336) 350-1642, phone; (336) 294-5005, fax Sees patients 1st and 3rd Saturday of every month.  Must not qualify for public or private insurance (i.e. Medicaid, Medicare, [place] Health Choice, Veterans' Benefits) • Household income should be no more than 200% of the poverty level •The clinic cannot treat you if you are pregnant or think you are pregnant • Sexually transmitted diseases are not treated at the clinic.  ° ° °Dental Care: °Organization         Address  Phone  Notes  °[facility] [address], [place] (336) 641-6152 Accepts children up to age 21 who are enrolled in Medicaid or [place] Health Choice; pregnant women with a Medicaid card; and children who have applied for Medicaid or [place] Health Choice, but were declined, whose parents can pay a reduced fee at time of service.  °[facility] [place]  [address], [place] (336) 641-7733 Accepts children up to age 21 who are enrolled  in Medicaid or [place] Health Choice; pregnant women with a Medicaid card; and children who have applied for Medicaid or [place] Health Choice, but were declined, whose parents can pay a reduced fee at time of service.  °[facility] ° [address], [place] (336) 641-4533 Patients are seen by appointment only. Walk-ins are not accepted. [facility] will see patients 18 years of age and older. °Monday - Tuesday (8am-5pm) °Most Wednesdays (8:30-5pm) °$30 per visit, cash only  °[facility] ° [address], [place] (336) 641-4533 Patients are seen by appointment only. Walk-ins are not accepted. [facility] will see patients 18 years of age and older. °One Wednesday Evening (Monthly: Volunteer Based).  $30 per visit, cash only  °[facility]  (919) [phone] for adults; Children under age 4, call Graduate Pediatric Dentistry at ([phone]) [phone]. Children aged 4-14, please call ([phone]) [phone] to request a pediatric application. ° Dental services are provided in all areas of dental care including fillings, crowns and bridges, complete and partial dentures, implants, gum treatment, root canals, and extractions. Preventive care is also provided. Treatment is provided to both adults and children. °Patients are selected via a lottery and there is often a waiting list. °  °[facility] [address], °[place] ° (336) 763-8833 [email or web address] °  °[facility] [address], [place], [place] (336)723-1848, Ext. 123 Second and Fourth Thursday of each month, opens at 6:30 AM; Clinic ends at 9 AM.  Patients are seen on a first-come first-served basis, and a limited number are seen during each clinic.  ° °[facility] ° [address], [place], [place] (336) 723-7904   Eligibility Requirements °You must have lived in [place], [place], or [place] counties for at least the last three months. °  You cannot be  eligible for state or federal sponsored healthcare insurance, including Veterans Administration, Medicaid, or Medicare. °    You generally cannot be eligible for healthcare insurance through your employer.  °  How to apply: °Eligibility screenings are held every Tuesday and Wednesday afternoon from 1:00 pm until 4:00 pm. You do not need an appointment for the interview!  °[facility] [address], [place], [place] 336-631-2330   °[facility]  336-342-8273   °[facility]  336-703-3100   °[facility]  336-570-6415   ° °Behavioral Health Resources in the Community: °Intensive Outpatient Programs °Organization         Address  Phone  Notes  °[facility] [address], [place], [place] 336-878-6098   °[facility] [address], [place], [place] 336-832-9800   °[facility] [address], [place], [place] ° 336-882-2125   °[facility] [address],  °[place], [place] 1-800-853-5163 or 336-641-4981   °Substance Abuse Resources °Organization         Address  Phone  Notes  °[facility]  336-882-2125   °[facility]  336-784-9470   °[facility]  336-285-9073   °[facility]  336-845-3988   °Residential & Outpatient Substance Abuse Program  1-800-659-3381   °Psychological Services °Organization         Address  Phone  Notes  °[facility]  336- 832-9600   °[facility]  336- 378-7881   °[facility] [address], [place] 1-800-853-5163 or 336-641-4981   ° °Mobile Crisis Teams °Organization         Address  Phone  Notes  °[facility], Mobile Crisis Care Unit  1-877-626-1772   °Assertive °[facility] ° [address] [place], [place] 336-834-9664   °Sharon DeEsch [address] °[place] 336-554-5454   ° °Self-Help/Support Groups °Organization          Address  Phone             Notes  °[facility] - variety of support groups  336- 373-1402 Call for more information  °Narcotics Anonymous (NA), [facility] [address], °[place]  2 meetings at this location  ° °Residential Treatment Programs °Organization         Address  Phone  Notes  °[facility] [address],    °[place]  1-866-801-8205   °[facility] ° [address], [place], [place] 704-293-8524   °[facility] [address], [place] 336-845-3988 Admissions: 8am-3pm M-F  °[facility] [address]    °[place], [place] 336-841-1104   °[facility] [address], [place], [place] 336-379-7146   °[facility] [address]  °[place], [place] 336-285-9073   °[facility] - Intensive Outpatient [address], [place], [place] 336-852-3033   °[facility] [address]  °[place], [place] 1-877-615-2722 or 336-784-9470   °[facility] [address] [place], [place] 336-227-7417 Accepts Medicaid  °[facility] [address]  ° [place] 1-800-659-3381 Substance Abuse/Addiction Treatment  ° °[place] Behavioral Health Resources °Organization         Address  Phone  Notes  °[facility]  (888) 581-9988   °Julie Brannon, PhD [address] [place],    (336) 349-5553 or (336) 951-0000   °[facility]   [address] °[place], [place] (336) 349-4454   °[facility] [address], [place], [place] (336) 342-8316 Insurance/Medicaid/sponsorship through [facility]  °[facility] [address]                                    [place], [place] (336) 342-8316 Therapy/tele-psych/case  °[facility] [address]  ° [place], [place] (336) 349-2233    °Dr. Arfeen  (336) 349-4544   °[facility] [place]  [employer]  [facility] 1) [address], [place] °2) [address], [place] °3)  [address], [place] (336) 349-3220 °(336) 342-7768 ° °(336) 342-8140   °[place] [facility] (336) 342-1394 or (336) 342-3537 (After Hours)    ° ° ° °

## 2013-11-26 NOTE — ED Provider Notes (Signed)
CSN: [id number]     Arrival date & time 11/26/13  1116 History   First MD Initiated Contact with Patient 11/26/13 1131     Chief Complaint  Patient presents with  . Back Pain  . Dizziness     (Consider location/radiation/quality/duration/timing/severity/associated sxs/prior Treatment) HPI Tara Roach is a 48 year old female past medical history of chronic back pain presents the ER today with upper back/neck pain/spasm, headache since Friday. Patient states her symptoms began at work on Friday, and have persisted despite use of Advil over the weekend. Patient states she has had this identical neck and back spasm in the past, which typically causes a frontal headache. Patient states her headache is also identical to the headaches she has had in the past. Patient reports also having positional light-headedness, and nausea since Friday. Pt states when she bends over she states "I don't feel right and feel like I'm going to pass out". Patient states when she begins to walk around, the lightheadedness subsides. Patient also reports having a 15 minute episode of substernal chest pain on Friday, which began at rest, and was made better with walking around. Patient states it has not returned since then. Patient denies any vomiting, abdominal pain, diarrhea, fever, chills, shortness of breath.  Past Medical History  Diagnosis Date  . Chronic back pain   . Heart murmur    Past Surgical History  Procedure Laterality Date  . Cesarean section     Family History  Problem Relation Age of Onset  . Cancer Father   . Hypertension Sister   . Diabetes Brother    History  Substance Use Topics  . Smoking status: Never Smoker   . Smokeless tobacco: Not on file  . Alcohol Use: No   OB History   Grav Para Term Preterm Abortions TAB SAB Ect Mult Living                 Review of Systems  Constitutional: Negative for fever.  HENT: Negative for trouble swallowing.   Eyes: Negative for visual disturbance.   Respiratory: Negative for shortness of breath.   Cardiovascular: Negative for chest pain.  Gastrointestinal: Positive for nausea. Negative for vomiting, abdominal pain, diarrhea and blood in stool.  Genitourinary: Negative for dysuria.  Musculoskeletal: Negative for neck pain.  Skin: Negative for rash.  Neurological: Positive for dizziness, light-headedness and headaches. Negative for syncope, speech difficulty, weakness and numbness.  Psychiatric/Behavioral: Negative.       Allergies  Reglan  Home Medications   Prior to Admission medications   Medication Sig Start Date End Date Taking? Authorizing Provider  cyclobenzaprine (FLEXERIL) 10 MG tablet Take 10 mg by mouth 3 (three) times daily as needed for muscle spasms.   Yes Historical Provider, MD  ibuprofen (ADVIL,MOTRIN) 200 MG tablet Take 200 mg by mouth every 6 (six) hours as needed for moderate pain.   Yes Historical Provider, MD   BP 126/47  Pulse 62  Temp(Src) 98.5 F (36.9 C) (Oral)  Resp 12  SpO2 100%  LMP 11/16/2013 Physical Exam  Nursing note and vitals reviewed. Constitutional: She is oriented to person, place, and time. She appears well-developed and well-nourished. No distress.  HENT:  Head: Normocephalic and atraumatic.  Mouth/Throat: Oropharynx is clear and moist. No oropharyngeal exudate.  Eyes: Right eye exhibits no discharge. Left eye exhibits no discharge. No scleral icterus.  Neck: Normal range of motion and full passive range of motion without pain. Neck supple. Muscular tenderness present. No spinous process tenderness  present. No rigidity. No edema, no erythema and normal range of motion present.    Cardiovascular: Normal rate, regular rhythm and S2 normal.   Murmur heard.  Systolic murmur is present with a grade of 2/6  Pulmonary/Chest: Effort normal and breath sounds normal. No respiratory distress.  Abdominal: Soft. There is no tenderness.  Musculoskeletal: Normal range of motion. She exhibits  no edema and no tenderness.  Neurological: She is alert and oriented to person, place, and time. She has normal strength. No cranial nerve deficit. Coordination normal. GCS eye subscore is 4. GCS verbal subscore is 5. GCS motor subscore is 6.  Patient is fully alert answering questions appropriately in full, clear sentences. Cranial nerves II through XII grossly intact. Motor strength 5 out of 5 in all major muscle groups of upper and lower extremities. Distal sensation intact. Finger to nose coordination testing normal.   Skin: Skin is warm and dry. No rash noted. She is not diaphoretic.  Psychiatric: She has a normal mood and affect.    ED Course  Procedures (including critical care time) Labs Review Labs Reviewed  COMPREHENSIVE METABOLIC PANEL - Abnormal; Notable for the following:    Potassium 5.4 (*)    Glucose, Bld 105 (*)    All other components within normal limits  URINALYSIS, ROUTINE W REFLEX MICROSCOPIC - Abnormal; Notable for the following:    APPearance CLOUDY (*)    Hgb urine dipstick TRACE (*)    Leukocytes, UA MODERATE (*)    All other components within normal limits  CBC WITH DIFFERENTIAL  PREGNANCY, URINE  URINE MICROSCOPIC-ADD ON  I-STAT TROPOININ, ED    Imaging Review No results found.   EKG Interpretation   Date/Time:  Sunday November 26 2013 11:37:37 EDT Ventricular Rate:  61 PR Interval:  154 QRS Duration: 91 QT Interval:  421 QTC Calculation: 424 R Axis:   49 Text Interpretation:  Sinus arrhythmia Baseline wander in lead(s) II III  aVF V4 Confirmed by PICKERING  MD, Harrold DonathNATHAN [phone]([id number]) on 11/26/2013 2:08:27 PM      MDM   Final diagnoses:  Headache, unspecified headache type    [age] her old female presenting with headache, neck pain, dizziness, nausea since Friday. No meningeal signs or concern for [facility], physical exam consistent with musculoskeletal pain the patient's trapezius on the left side. Patient reporting her headache as being consistent with  previous ones, no new headache, neuro exam benign. Symptomatic therapy, workup for rule out ACS. Wells criteria negative for PE. Orthostatic vital signs negative for positive orthostasis or signs of hypovolemia.  UA remarkable for moderate leukocytes and urine. Patient continues to deny any dysuria. Patient with potassium level of 5.4 noted on lab test is moderate hemolysis. Patient has normal renal function. I believe this to be due to artifact from hemolysis. Patient's other labs unremarkable, no obvious electrolyte abnormality otherwise. EKG unremarkable.  2:56 PM: Patient stating her pain is 0/10 at this time. We'll discharge. Pt HA treated and improved while in ED.  Presentation is like pts typical HA and non concerning for [facility], ICH, Meningitis, or temporal arteritis. Pt is afebrile with no focal neuro deficits, nuchal rigidity, or change in vision. Pt is to follow up with PCP to discuss prophylactic medication. Pt verbalizes understanding and is agreeable with plan to dc.   BP 126/47  Pulse 62  Temp(Src) 98.5 F (36.9 C) (Oral)  Resp 12  SpO2 100%  LMP 11/16/2013  Signed,  Ladona MowJoe Lamonda Noxon, PA-C 5:06 PM  This  patient discussed with Dr. Benjiman CoreNathan Pickering, MD  Monte FantasiaJoseph W Marietta Sikkema, PA-C 11/26/13 [phone]

## 2013-11-28 ENCOUNTER — Ambulatory Visit: Payer: Commercial Managed Care - PPO | Attending: Orthopaedic Surgery | Admitting: Physical Therapy

## 2013-11-28 DIAGNOSIS — Z5189 Encounter for other specified aftercare: Secondary | ICD-10-CM | POA: Insufficient documentation

## 2013-11-28 DIAGNOSIS — M25562 Pain in left knee: Secondary | ICD-10-CM | POA: Insufficient documentation

## 2013-11-28 DIAGNOSIS — M542 Cervicalgia: Secondary | ICD-10-CM | POA: Insufficient documentation

## 2013-11-28 DIAGNOSIS — M25561 Pain in right knee: Secondary | ICD-10-CM | POA: Insufficient documentation

## 2013-11-29 NOTE — ED Provider Notes (Signed)
Medical screening examination/treatment/procedure(s) were performed by non-physician practitioner and as supervising physician I was immediately available for consultation/collaboration.   EKG Interpretation   Date/Time:  Sunday November 26 2013 11:37:37 EDT Ventricular Rate:  61 PR Interval:  154 QRS Duration: 91 QT Interval:  421 QTC Calculation: 424 R Axis:   49 Text Interpretation:  Sinus arrhythmia Baseline wander in lead(s) II III  aVF V4 Confirmed by Rubin PayorPICKERING  MD, Ronya Gilcrest [phone]([id number]) on 11/26/2013 2:08:27 PM       Juliet RudeNathan R. Rubin PayorPickering, MD 11/29/13 2248

## 2014-02-21 ENCOUNTER — Encounter (HOSPITAL_COMMUNITY): Payer: Self-pay | Admitting: *Deleted

## 2014-02-21 ENCOUNTER — Emergency Department (INDEPENDENT_AMBULATORY_CARE_PROVIDER_SITE_OTHER)
Admission: EM | Admit: 2014-02-21 | Discharge: 2014-02-21 | Disposition: A | Discharge status: Home / Self Care | Payer: Commercial Managed Care - PPO | Source: Home / Self Care | Attending: Family Medicine | Admitting: Family Medicine

## 2014-02-21 DIAGNOSIS — H6982 Other specified disorders of Eustachian tube, left ear: Secondary | ICD-10-CM | POA: Diagnosis not present

## 2014-02-21 MED ORDER — FLUTICASONE PROPIONATE 50 MCG/ACT NA SUSP: 2.0000 | Freq: Every day | NASAL | Status: DC

## 2014-02-21 NOTE — ED Provider Notes (Signed)
CSN: [id number]     Arrival date & time 02/21/14  1550 History   First MD Initiated Contact with Patient 02/21/14 1627     Chief Complaint  Patient presents with  . Otalgia   (Consider location/radiation/quality/duration/timing/severity/associated sxs/prior Treatment) HPI Comments: Nonsmoker Works in housekeeping services  Patient is a 49 y.o. female presenting with ear pain. The history is provided by the patient.  Otalgia Location:  Left Quality:  Pressure Severity:  Mild Onset quality:  Gradual Duration:  1 week Timing:  Constant Progression:  Unchanged Chronicity:  New Ineffective treatments:  None tried Associated symptoms: congestion   Associated symptoms: no cough, no ear discharge, no fever, no headaches, no neck pain, no rash, no rhinorrhea, no sore throat, no tinnitus and no vomiting   Associated symptoms comment:  +muffled hearing and occasional sense of dysequilibrium. +post nasal drainage   Past Medical History  Diagnosis Date  . Chronic back pain   . Heart murmur    Past Surgical History  Procedure Laterality Date  . Cesarean section     Family History  Problem Relation Age of Onset  . Cancer Father   . Hypertension Sister   . Diabetes Brother    History  Substance Use Topics  . Smoking status: Never Smoker   . Smokeless tobacco: Not on file  . Alcohol Use: No   OB History    No data available     Review of Systems  Constitutional: Negative for fever.  HENT: Positive for congestion and ear pain. Negative for ear discharge, rhinorrhea, sore throat and tinnitus.   Respiratory: Negative for cough.   Gastrointestinal: Negative for vomiting.  Musculoskeletal: Negative for neck pain.  Skin: Negative for rash.  Neurological: Negative for headaches.  All other systems reviewed and are negative.   Allergies  Reglan  Home Medications   Prior to Admission medications   Medication Sig Start Date End Date Taking? Authorizing Provider   cyclobenzaprine (FLEXERIL) 10 MG tablet Take 10 mg by mouth 3 (three) times daily as needed for muscle spasms.    Historical Provider, MD  fluticasone (FLONASE) 50 MCG/ACT nasal spray Place 2 sprays into both nostrils daily. 02/21/14   Mathis FareJennifer Lee H Chanta Bauers, PA  ibuprofen (ADVIL,MOTRIN) 200 MG tablet Take 200 mg by mouth every 6 (six) hours as needed for moderate pain.    Historical Provider, MD   BP 116/78 mmHg  Pulse 58  Temp(Src) 98.2 F (36.8 C) (Oral)  Resp 16  SpO2 98%  LMP 02/15/2014 Physical Exam  Constitutional: She is oriented to person, place, and time. She appears well-developed and well-nourished. No distress.  HENT:  Head: Normocephalic and atraumatic.  Right Ear: Hearing, tympanic membrane, external ear and ear canal normal.  Left Ear: External ear and ear canal normal. No drainage, swelling or tenderness. No mastoid tenderness. Tympanic membrane is retracted. Tympanic membrane is not injected, not perforated and not erythematous. A middle ear effusion is present. Decreased hearing is noted.  Nose: Nose normal.  Mouth/Throat: Uvula is midline, oropharynx is clear and moist and mucous membranes are normal. Normal dentition.  Eyes: Conjunctivae are normal. No scleral icterus.  Neck: Normal range of motion. Neck supple.  Cardiovascular: Normal rate, regular rhythm and normal heart sounds.   Pulmonary/Chest: Effort normal and breath sounds normal.  Musculoskeletal: Normal range of motion.  Lymphadenopathy:    She has no cervical adenopathy.  Neurological: She is alert and oriented to person, place, and time.  Skin: Skin is warm  and dry. No rash noted. No erythema.  Psychiatric: She has a normal mood and affect. Her behavior is normal.  Nursing note and vitals reviewed.   ED Course  Procedures (including critical care time) Labs Review Labs Reviewed - No data to display  Imaging Review No results found.   MDM   1. Eustachian tube dysfunction, left   Flonase as  directed with ENT follow up if no improvement.    Ria Clock, [place] 02/21/14 [phone]

## 2014-02-21 NOTE — ED Notes (Signed)
Pt  Reports  Symptoms  Of  Bilateral  Earache  l  Worse  Than  The  r     X  approx  1    Week     She  Reports  Recently  Seen  By the  Dentist  For    Gum problems       She  Is  Sitting upright  On  Exam table      In  No  severe  Distress

## 2014-04-01 ENCOUNTER — Emergency Department (HOSPITAL_COMMUNITY): Payer: Commercial Managed Care - PPO

## 2014-04-01 ENCOUNTER — Encounter (HOSPITAL_COMMUNITY): Payer: Self-pay

## 2014-04-01 ENCOUNTER — Emergency Department (HOSPITAL_COMMUNITY)
Admission: EM | Admit: 2014-04-01 | Discharge: 2014-04-01 | Disposition: A | Discharge status: Home / Self Care | Payer: Commercial Managed Care - PPO | Attending: Emergency Medicine | Admitting: Emergency Medicine

## 2014-04-01 DIAGNOSIS — Z79899 Other long term (current) drug therapy: Secondary | ICD-10-CM | POA: Insufficient documentation

## 2014-04-01 DIAGNOSIS — M5136 Other intervertebral disc degeneration, lumbar region: Secondary | ICD-10-CM | POA: Insufficient documentation

## 2014-04-01 DIAGNOSIS — R011 Cardiac murmur, unspecified: Secondary | ICD-10-CM | POA: Diagnosis not present

## 2014-04-01 DIAGNOSIS — G8929 Other chronic pain: Secondary | ICD-10-CM | POA: Diagnosis not present

## 2014-04-01 DIAGNOSIS — Z7951 Long term (current) use of inhaled steroids: Secondary | ICD-10-CM | POA: Diagnosis not present

## 2014-04-01 DIAGNOSIS — M545 Low back pain: Secondary | ICD-10-CM | POA: Diagnosis present

## 2014-04-01 MED ORDER — HYDROCODONE-ACETAMINOPHEN 5-325 MG PO TABS
1.0000 | ORAL_TABLET | Freq: Four times a day (QID) | ORAL | Status: DC | PRN
Start: 2014-04-01 — End: 2014-04-04

## 2014-04-01 NOTE — Discharge Instructions (Signed)

## 2014-04-01 NOTE — ED Notes (Signed)
Patient presents today with a chief complaint of right lower back pain and left posterior forearm pain since falling while cleaning a shower this morning. Patient denies hitting head, denies LOC. No obvious deformity.

## 2014-04-01 NOTE — ED Provider Notes (Signed)
CSN: [id number]     Arrival date & time 04/01/14  1109 History   First MD Initiated Contact with Patient 04/01/14 1114     Chief Complaint  Patient presents with  . Back Pain     (Consider location/radiation/quality/duration/timing/severity/associated sxs/prior Treatment) HPI Comments: Tara Roach while cleaning a shower at work this morning. No loc. Mechanical fall. State that her left forearm and right back are sore  Patient is a 49 y.o. female presenting with back pain. The history is provided by the patient. No language interpreter was used.  Back Pain Location:  Lumbar spine Quality:  Aching Radiates to:  Does not radiate Pain severity:  Moderate Onset quality:  Sudden Timing:  Constant Progression:  Unchanged Chronicity:  New Context: falling   Relieved by:  Nothing Worsened by:  Movement Ineffective treatments:  None tried Associated symptoms: no bladder incontinence, no bowel incontinence, no fever, no numbness, no paresthesias and no perianal numbness     Past Medical History  Diagnosis Date  . Chronic back pain   . Heart murmur    Past Surgical History  Procedure Laterality Date  . Cesarean section     Family History  Problem Relation Age of Onset  . Cancer Father   . Hypertension Sister   . Diabetes Brother    History  Substance Use Topics  . Smoking status: Never Smoker   . Smokeless tobacco: Not on file  . Alcohol Use: No   OB History    No data available     Review of Systems  Constitutional: Negative for fever.  Gastrointestinal: Negative for bowel incontinence.  Genitourinary: Negative for bladder incontinence.  Musculoskeletal: Positive for back pain.  Neurological: Negative for numbness and paresthesias.  All other systems reviewed and are negative.     Allergies  Reglan  Home Medications   Prior to Admission medications   Medication Sig Start Date End Date Taking? Authorizing Provider  cyclobenzaprine (FLEXERIL) 10 MG tablet Take 10  mg by mouth 3 (three) times daily as needed for muscle spasms.    Historical Provider, MD  fluticasone (FLONASE) 50 MCG/ACT nasal spray Place 2 sprays into both nostrils daily. 02/21/14   Mathis FareJennifer Lee H Presson, PA  ibuprofen (ADVIL,MOTRIN) 200 MG tablet Take 200 mg by mouth every 6 (six) hours as needed for moderate pain.    Historical Provider, MD   BP 127/60 mmHg  Pulse 54  Temp(Src) 97.8 F (36.6 C) (Oral)  Resp 18  SpO2 100%  LMP 03/25/2014 Physical Exam  Constitutional: She is oriented to person, place, and time. She appears well-developed and well-nourished.  Cardiovascular: Normal rate and regular rhythm.   Pulmonary/Chest: Effort normal and breath sounds normal.  Abdominal: Soft. Bowel sounds are normal. There is no tenderness.  Musculoskeletal: Normal range of motion.       Cervical back: Normal.       Thoracic back: Normal.       Lumbar back: She exhibits tenderness and bony tenderness. She exhibits normal range of motion.  Full rom or forearm. No swelling or deformity to area  Neurological: She is alert and oriented to person, place, and time. She exhibits normal muscle tone. Coordination normal.  Skin: Skin is warm and dry.  Psychiatric: She has a normal mood and affect.  Nursing note and vitals reviewed.   ED Course  Procedures (including critical care time) Labs Review Labs Reviewed - No data to display  Imaging Review Dg Lumbar Spine Complete  04/01/2014  CLINICAL DATA:  Patient status post fall at work.  Right-sided pain.  EXAM: LUMBAR SPINE - COMPLETE 4+ VIEW  COMPARISON:  None.  FINDINGS: Normal anatomic alignment. No evidence for acute fracture or dislocation. L3-4 degenerative disc disease. Preservation of the vertebral body heights. SI joints are unremarkable. Pelvic phleboliths.  IMPRESSION: L3-4 degenerative disc disease.  No acute osseous abnormality.   Electronically Signed   By: Annia Belt M.D.   On: 04/01/2014 12:23     EKG Interpretation None       MDM   Final diagnoses:  Degenerative lumbar disc    No acute injury. Will treat symptomatically with hydrocodone. Pt is neurologically intact    Teressa Lower, NP 04/01/14 1232  Glynn Octave, MD 04/01/14 [phone]

## 2014-04-04 ENCOUNTER — Encounter (HOSPITAL_COMMUNITY): Payer: Self-pay | Admitting: Emergency Medicine

## 2014-04-04 ENCOUNTER — Emergency Department (INDEPENDENT_AMBULATORY_CARE_PROVIDER_SITE_OTHER): Payer: Commercial Managed Care - PPO

## 2014-04-04 ENCOUNTER — Emergency Department (INDEPENDENT_AMBULATORY_CARE_PROVIDER_SITE_OTHER)
Admission: EM | Admit: 2014-04-04 | Discharge: 2014-04-04 | Disposition: A | Discharge status: Home / Self Care | Payer: Commercial Managed Care - PPO | Source: Home / Self Care | Attending: Family Medicine | Admitting: Family Medicine

## 2014-04-04 DIAGNOSIS — W19XXXA Unspecified fall, initial encounter: Secondary | ICD-10-CM

## 2014-04-04 DIAGNOSIS — M542 Cervicalgia: Secondary | ICD-10-CM

## 2014-04-04 DIAGNOSIS — Y990 Civilian activity done for income or pay: Secondary | ICD-10-CM

## 2014-04-04 DIAGNOSIS — M79602 Pain in left arm: Secondary | ICD-10-CM

## 2014-04-04 MED ORDER — DICLOFENAC SODIUM 50 MG PO TBEC: 50.0000 mg | DELAYED_RELEASE_TABLET | Freq: Two times a day (BID) | ORAL | Status: DC | PRN

## 2014-04-04 NOTE — ED Provider Notes (Signed)
Tara Roach is a 49 y.o. female who presents to Urgent Care today for neck pain. Patient fell on the 14th at work landing on her left arm. She does not recall striking her head or losing consciousness. She was seen in the emergency department on the 14th and x-ray of lumbar spine revealed no fractures. Since then she's been taking ibuprofen which helps a bit. She notes left-sided neck pain that radiates to the head and some left arm pain. She denies any neck pain radiating to the arm. No weakness or numbness bowel bladder dysfunction or difficulty walking.   Past Medical History  Diagnosis Date  . Chronic back pain   . Heart murmur    Past Surgical History  Procedure Laterality Date  . Cesarean section     History  Substance Use Topics  . Smoking status: Never Smoker   . Smokeless tobacco: Not on file  . Alcohol Use: No   ROS as above Medications: No current facility-administered medications for this encounter.   Current Outpatient Prescriptions  Medication Sig Dispense Refill  . cyclobenzaprine (FLEXERIL) 10 MG tablet Take 10 mg by mouth 3 (three) times daily as needed for muscle spasms.    . diclofenac (VOLTAREN) 50 MG EC tablet Take 1 tablet (50 mg total) by mouth 2 (two) times daily as needed. 30 tablet 0  . fluticasone (FLONASE) 50 MCG/ACT nasal spray Place 2 sprays into both nostrils daily. 16 g 2   Allergies  Allergen Reactions  . Reglan [Metoclopramide]     Panic attack, can't breath     Exam:  BP 141/80 mmHg  Pulse 52  Temp(Src) 98.2 F (36.8 C) (Oral)  Resp 18  SpO2 100%  LMP 03/25/2014 Gen: Well NAD HEENT: EOMI,  MMM  Lungs: Normal work of breathing. CTABL Heart: RRR no MRG Abd: NABS, Soft. Nondistended, Nontender Exts: Brisk capillary refill, warm and well perfused.  Neck: Nontender to spinal midline. Tender palpation left cervical paraspinal and trapezius. Normal neck range of motion Upper extremity strength is intact throughout reflexes are equal  and normal bilaterally. Left arm normal-appearing nontender normal motion elbow and shoulder.  No results found for this or any previous visit (from the past 24 hour(s)). Dg Cervical Spine Complete  04/04/2014   CLINICAL DATA:  Left head pain following a fall last week.  EXAM: CERVICAL SPINE  4+ VIEWS  COMPARISON:  None.  FINDINGS: Mild scoliosis and mild reversal of the normal lordosis in the lower cervical spine. No prevertebral soft tissue swelling, fractures or subluxations. Mild fragmented anterior spur formation at the C5-6 level.  IMPRESSION: No fracture or subluxation.   Electronically Signed   By: Beckie SaltsSteven  Reid M.D.   On: 04/04/2014 19:03    Assessment and Plan: 49 y.o. female with cervical neck strain secondary to fall. Treat with Flexeril and diclofenac. Follow-up with orthopedics. Work note provided.  Discussed warning signs or symptoms. Please see discharge instructions. Patient expresses understanding.     Rodolph BongEvan S Grigor Lipschutz, MD 04/04/14 [phone]

## 2014-04-04 NOTE — Discharge Instructions (Signed)
Thank you for coming in today. Follow-up with orthopedics   Cervical Sprain A cervical sprain is an injury in the neck in which the strong, fibrous tissues (ligaments) that connect your neck bones stretch or tear. Cervical sprains can range from mild to severe. Severe cervical sprains can cause the neck vertebrae to be unstable. This can lead to damage of the spinal cord and can result in serious nervous system problems. The amount of time it takes for a cervical sprain to get better depends on the cause and extent of the injury. Most cervical sprains heal in 1 to 3 weeks. CAUSES  Severe cervical sprains may be caused by:   Contact sport injuries (such as from football, rugby, wrestling, hockey, auto racing, gymnastics, diving, martial arts, or boxing).   Motor vehicle collisions.   Whiplash injuries. This is an injury from a sudden forward and backward whipping movement of the head and neck.  Falls.  Mild cervical sprains may be caused by:   Being in an awkward position, such as while cradling a telephone between your ear and shoulder.   Sitting in a chair that does not offer proper support.   Working at a poorly Marketing executive station.   Looking up or down for long periods of time.  SYMPTOMS   Pain, soreness, stiffness, or a burning sensation in the front, back, or sides of the neck. This discomfort may develop immediately after the injury or slowly, 24 hours or more after the injury.   Pain or tenderness directly in the middle of the back of the neck.   Shoulder or upper back pain.   Limited ability to move the neck.   Headache.   Dizziness.   Weakness, numbness, or tingling in the hands or arms.   Muscle spasms.   Difficulty swallowing or chewing.   Tenderness and swelling of the neck.  DIAGNOSIS  Most of the time your health care provider can diagnose a cervical sprain by taking your history and doing a physical exam. Your health care provider  will ask about previous neck injuries and any known neck problems, such as arthritis in the neck. X-rays may be taken to find out if there are any other problems, such as with the bones of the neck. Other tests, such as a CT scan or MRI, may also be needed.  TREATMENT  Treatment depends on the severity of the cervical sprain. Mild sprains can be treated with rest, keeping the neck in place (immobilization), and pain medicines. Severe cervical sprains are immediately immobilized. Further treatment is done to help with pain, muscle spasms, and other symptoms and may include:  Medicines, such as pain relievers, numbing medicines, or muscle relaxants.   Physical therapy. This may involve stretching exercises, strengthening exercises, and posture training. Exercises and improved posture can help stabilize the neck, strengthen muscles, and help stop symptoms from returning.  HOME CARE INSTRUCTIONS   Put ice on the injured area.   Put ice in a plastic bag.   Place a towel between your skin and the bag.   Leave the ice on for 15-20 minutes, 3-4 times a day.   If your injury was severe, you may have been given a cervical collar to wear. A cervical collar is a two-piece collar designed to keep your neck from moving while it heals.  Do not remove the collar unless instructed by your health care provider.  If you have long hair, keep it outside of the collar.  Ask your  health care provider before making any adjustments to your collar. Minor adjustments may be required over time to improve comfort and reduce pressure on your chin or on the back of your head.  Ifyou are allowed to remove the collar for cleaning or bathing, follow your health care provider's instructions on how to do so safely.  Keep your collar clean by wiping it with mild soap and water and drying it completely. If the collar you have been given includes removable pads, remove them every 1-2 days and hand wash them with soap and  water. Allow them to air dry. They should be completely dry before you wear them in the collar.  If you are allowed to remove the collar for cleaning and bathing, wash and dry the skin of your neck. Check your skin for irritation or sores. If you see any, tell your health care provider.  Do not drive while wearing the collar.   Only take over-the-counter or prescription medicines for pain, discomfort, or fever as directed by your health care provider.   Keep all follow-up appointments as directed by your health care provider.   Keep all physical therapy appointments as directed by your health care provider.   Make any needed adjustments to your workstation to promote good posture.   Avoid positions and activities that make your symptoms worse.   Warm up and stretch before being active to help prevent problems.  SEEK MEDICAL CARE IF:   Your pain is not controlled with medicine.   You are unable to decrease your pain medicine over time as planned.   Your activity level is not improving as expected.  SEEK IMMEDIATE MEDICAL CARE IF:   You develop any bleeding.  You develop stomach upset.  You have signs of an allergic reaction to your medicine.   Your symptoms get worse.   You develop new, unexplained symptoms.   You have numbness, tingling, weakness, or paralysis in any part of your body.  MAKE SURE YOU:   Understand these instructions.  Will watch your condition.  Will get help right away if you are not doing well or get worse. Document Released: 11/30/2006 Document Revised: 02/07/2013 Document Reviewed: 08/10/2012 [facility] Patient Information 2015 [employer], [place]. This information is not intended to replace advice given to you by your health care provider. Make sure you discuss any questions you have with your health care provider.

## 2014-04-04 NOTE — ED Notes (Signed)
C/o intermittent sharp pain on left side of head [facility] for a fall she sustained at work Alert, no signs of acute distress.

## 2014-07-16 ENCOUNTER — Encounter (HOSPITAL_COMMUNITY): Payer: Self-pay | Admitting: *Deleted

## 2014-07-16 ENCOUNTER — Emergency Department (HOSPITAL_COMMUNITY)
Admission: EM | Admit: 2014-07-16 | Discharge: 2014-07-16 | Disposition: A | Discharge status: Home / Self Care | Payer: Commercial Managed Care - PPO | Attending: Emergency Medicine | Admitting: Emergency Medicine

## 2014-07-16 DIAGNOSIS — G8929 Other chronic pain: Secondary | ICD-10-CM | POA: Diagnosis not present

## 2014-07-16 DIAGNOSIS — R011 Cardiac murmur, unspecified: Secondary | ICD-10-CM | POA: Diagnosis not present

## 2014-07-16 DIAGNOSIS — M25569 Pain in unspecified knee: Secondary | ICD-10-CM | POA: Diagnosis present

## 2014-07-16 DIAGNOSIS — M1710 Unilateral primary osteoarthritis, unspecified knee: Secondary | ICD-10-CM

## 2014-07-16 DIAGNOSIS — M170 Bilateral primary osteoarthritis of knee: Secondary | ICD-10-CM | POA: Insufficient documentation

## 2014-07-16 MED ORDER — HYDROCODONE-ACETAMINOPHEN 5-325 MG PO TABS: 1.0000 | ORAL_TABLET | ORAL | Status: AC | PRN

## 2014-07-16 MED ORDER — DICLOFENAC SODIUM 1 % TD GEL: 4.0000 g | Freq: Four times a day (QID) | TRANSDERMAL | Status: AC

## 2014-07-16 MED ORDER — NAPROXEN 500 MG PO TABS
500.0000 mg | ORAL_TABLET | Freq: Once | ORAL | Status: AC
  Administered 2014-07-16: 500 mg via ORAL
  Filled 2014-07-16: qty 1

## 2014-07-16 NOTE — Discharge Instructions (Signed)
Arthritis, Nonspecific °Arthritis is inflammation of a joint. This usually means pain, redness, warmth or swelling are present. One or more joints may be involved. There are a number of types of arthritis. Your caregiver may not be able to tell what type of arthritis you have right away. °CAUSES  °The most common cause of arthritis is the wear and tear on the joint (osteoarthritis). This causes damage to the cartilage, which can break down over time. The knees, hips, back and neck are most often affected by this type of arthritis. °Other types of arthritis and common causes of joint pain include: °· Sprains and other injuries near the joint. Sometimes minor sprains and injuries cause pain and swelling that develop hours later. °· Rheumatoid arthritis. This affects hands, feet and knees. It usually affects both sides of your body at the same time. It is often associated with chronic ailments, fever, weight loss and general weakness. °· Crystal arthritis. Gout and pseudo gout can cause occasional acute severe pain, redness and swelling in the foot, ankle, or knee. °· Infectious arthritis. Bacteria can get into a joint through a break in overlying skin. This can cause infection of the joint. Bacteria and viruses can also spread through the blood and affect your joints. °· Drug, infectious and allergy reactions. Sometimes joints can become mildly painful and slightly swollen with these types of illnesses. °SYMPTOMS  °· Pain is the main symptom. °· Your joint or joints can also be red, swollen and warm or hot to the touch. °· You may have a fever with certain types of arthritis, or even feel overall ill. °· The joint with arthritis will hurt with movement. Stiffness is present with some types of arthritis. °DIAGNOSIS  °Your caregiver will suspect arthritis based on your description of your symptoms and on your exam. Testing may be needed to find the type of arthritis: °· Blood and sometimes urine tests. °· X-ray tests  and sometimes CT or MRI scans. °· Removal of fluid from the joint (arthrocentesis) is done to check for bacteria, crystals or other causes. Your caregiver (or a specialist) will numb the area over the joint with a local anesthetic, and use a needle to remove joint fluid for examination. This procedure is only minimally uncomfortable. °· Even with these tests, your caregiver may not be able to tell what kind of arthritis you have. Consultation with a specialist (rheumatologist) may be helpful. °TREATMENT  °Your caregiver will discuss with you treatment specific to your type of arthritis. If the specific type cannot be determined, then the following general recommendations may apply. °Treatment of severe joint pain includes: °· Rest. °· Elevation. °· Anti-inflammatory medication (for example, ibuprofen) may be prescribed. Avoiding activities that cause increased pain. °· Only take over-the-counter or prescription medicines for pain and discomfort as recommended by your caregiver. °· Cold packs over an inflamed joint may be used for 10 to 15 minutes every hour. Hot packs sometimes feel better, but do not use overnight. Do not use hot packs if you are diabetic without your caregiver's permission. °· A cortisone shot into arthritic joints may help reduce pain and swelling. °· Any acute arthritis that gets worse over the next 1 to 2 days needs to be looked at to be sure there is no joint infection. °Long-term arthritis treatment involves modifying activities and lifestyle to reduce joint stress jarring. This can include weight loss. Also, exercise is needed to nourish the joint cartilage and remove waste. This helps keep the muscles   around the joint strong. HOME CARE INSTRUCTIONS   Do not take aspirin to relieve pain if gout is suspected. This elevates uric acid levels.  Only take over-the-counter or prescription medicines for pain, discomfort or fever as directed by your caregiver.  Rest the joint as much as  possible.  If your joint is swollen, keep it elevated.  Use crutches if the painful joint is in your leg.  Drinking plenty of fluids may help for certain types of arthritis.  Follow your caregiver's dietary instructions.  Try low-impact exercise such as:  Swimming.  Water aerobics.  Biking.  Walking.  Morning stiffness is often relieved by a warm shower.  Put your joints through regular range-of-motion. SEEK MEDICAL CARE IF:   You do not feel better in 24 hours or are getting worse.  You have side effects to medications, or are not getting better with treatment. SEEK IMMEDIATE MEDICAL CARE IF:   You have a fever.  You develop severe joint pain, swelling or redness.  Many joints are involved and become painful and swollen.  There is severe back pain and/or leg weakness.  You have loss of bowel or bladder control. Document Released: 03/12/2004 Document Revised: 04/27/2011 Document Reviewed: 03/28/2008 [facility] Patient Information 2015 [place]ExitCare, [place]LLC. This information is not intended to replace advice given to you by your health care provider. Make sure you discuss any questions you have with your health care provider. RICE: Routine Care for Injuries Rest, Ice, Compression, and Elevation (RICE) are often used to care for injuries. HOME CARE  Rest your injury.  Put ice on the injury.  Put ice in a plastic bag.  Place a towel between your skin and the bag.  Leave the ice on for 15-20 minutes, 03-04 times a day. Do this for as long as told by your doctor.  Apply pressure (compression) with an elastic bandage. Remove and reapply the bandage every 3 to 4 hours. Do not wrap the bandage too tight. Wrap the bandage looser if the fingers or toes are puffy (swollen), blue, cold, painful, or lose feeling (numb).  Raise (elevate) your injury. Raise your injury above the heart if you can. GET HELP RIGHT AWAY IF:  You have lasting pain or puffiness.  Your injury is red,  weak, or loses feeling.  Your problems get worse, not better, after several days. MAKE SURE YOU:  Understand these instructions.  Will watch your condition.  Will get help right away if you are not doing well or get worse. Document Released: 07/22/2007 Document Revised: 04/27/2011 Document Reviewed: 07/04/2010 [facility] BradfordExitCare Patient Information 2015 [employer]ExitCare, [place]LLC. This information is not intended to replace advice given to you by your health care provider. Make sure you discuss any questions you have with your health care provider.

## 2014-07-16 NOTE — ED Provider Notes (Signed)
CSN: [id number]     Arrival date & time 07/16/14  2026 History   None   This chart was scribed for non-physician practitioner, Antony Madura, PA, working with Rolan Bucco, MD by Marica Otter, ED Scribe. This patient was seen in room WTR5/WTR5 and the patient's care was started at 9:35 PM.  Chief Complaint  Patient presents with  . Knee Pain   HPI PCP: No PCP Per Patient HPI Comments: Tara Roach is a 49 y.o. female, with PMH noted below, who presents to the Emergency Department complaining of chronic, intermittent, worsening, shooting/sharp aching bilateral knee pain with associated swelling onset over a year ago. Pt notes the pain is worse when she gets up to walk after sitting for an extended period of time-- pt specifies that it often feels like her knees are "locking." Pt reports taking extra strength tylenol and bc arthritis powder with minimal relief. Pt further states that she followed up [facility] in January for her Sx. Pt denies any other symptoms at this time.   Past Medical History  Diagnosis Date  . Chronic back pain   . Heart murmur    Past Surgical History  Procedure Laterality Date  . Cesarean section     Family History  Problem Relation Age of Onset  . Cancer Father   . Hypertension Sister   . Diabetes Brother    History  Substance Use Topics  . Smoking status: Never Smoker   . Smokeless tobacco: Not on file  . Alcohol Use: No   OB History    No data available      Review of Systems  Constitutional: Negative for fever and chills.  Musculoskeletal: Positive for joint swelling and arthralgias.       Bilateral knee pain   All other systems reviewed and are negative.   Allergies  Tramadol and Reglan  Home Medications   Prior to Admission medications   Medication Sig Start Date End Date Taking? Authorizing Provider  acetaminophen (TYLENOL) 500 MG tablet Take 1,000 mg by mouth every 6 (six) hours as needed (for pain.).   Yes  Historical Provider, MD  cyclobenzaprine (FLEXERIL) 10 MG tablet Take 10 mg by mouth at bedtime.    Yes Historical Provider, MD  diclofenac (VOLTAREN) 50 MG EC tablet Take 1 tablet (50 mg total) by mouth 2 (two) times daily as needed. Patient not taking: Reported on 07/16/2014 04/04/14   Rodolph Bong, MD  diclofenac sodium (VOLTAREN) 1 % GEL Apply 4 g topically 4 (four) times daily. 07/16/14   Antony Madura, PA-C  fluticasone (FLONASE) 50 MCG/ACT nasal spray Place 2 sprays into both nostrils daily. Patient not taking: Reported on 07/16/2014 02/21/14   Ria Clock, PA  HYDROcodone-acetaminophen (NORCO/VICODIN) 5-325 MG per tablet Take 1 tablet by mouth every 4 (four) hours as needed for severe pain. 07/16/14   Antony Madura, PA-C   Triage Vitals: BP 121/63 mmHg  Pulse 51  Temp(Src) 97.6 F (36.4 C) (Oral)  Resp 16  SpO2 100%  LMP 07/09/2014  Physical Exam  Constitutional: She is oriented to person, place, and time. She appears well-developed and well-nourished. No distress.  HENT:  Head: Normocephalic and atraumatic.  Eyes: Conjunctivae and EOM are normal. No scleral icterus.  Neck: Normal range of motion.  Cardiovascular: Normal rate, regular rhythm and intact distal pulses.   DP and PT pulses 2+ bilaterally  Pulmonary/Chest: Effort normal. No respiratory distress.  Musculoskeletal: Normal range of motion.  Right knee: She exhibits swelling (Mild). She exhibits normal range of motion, no effusion, no deformity, no erythema, normal alignment, no LCL laxity, normal patellar mobility, no bony tenderness and no MCL laxity. Tenderness found.       Left knee: She exhibits swelling (Mild). She exhibits normal range of motion, no effusion, no deformity, no erythema, normal alignment, no LCL laxity, normal patellar mobility, no bony tenderness and no MCL laxity. Tenderness found.  Neurological: She is alert and oriented to person, place, and time. She exhibits normal muscle tone.  Coordination normal.  Sensation to light touch intact in bilateral lower extremities. Patient ambulatory with steady gait.  Skin: Skin is warm and dry. No rash noted. She is not diaphoretic. No erythema. No pallor.  Psychiatric: She has a normal mood and affect. Her behavior is normal.  Nursing note and vitals reviewed.   ED Course  Procedures (including critical care time) DIAGNOSTIC STUDIES: Oxygen Saturation is 100% on RA, nl by my interpretation.    COORDINATION OF CARE: 9:42 PM-Discussed treatment plan which includes voltaren gel, meds, f/u with [facility] with pt at bedside and pt agreed to plan.   Labs Review Labs Reviewed - No data to display  Imaging Review No results found.   EKG Interpretation None      MDM   Final diagnoses:  Arthritis of knee    49 year old female presents to the emergency department for further evaluation of acute on chronic knee pain. Patient neurovascularly intact. She ambulates without difficulty. No evidence of septic joint. No bony deformity or crepitus. Symptoms consistent with arthritis of bilateral knees. No indication for further emergent workup or imaging. Patient given knee sleeves in ED. Have advised orthopedic referral and application of Voltaren gel QID. Short course of Norco given for severe pain. RICE instruction and return precautions given. Patient agreeable to plan with no unaddressed concerns. Patient discharged in good condition.  I personally performed the services described in this documentation, which was scribed in my presence. The recorded information has been reviewed and is accurate.   Filed Vitals:   07/16/14 2038  BP: 121/63  Pulse: 51  Temp: 97.6 F (36.4 C)  TempSrc: Oral  Resp: 16  SpO2: 100%      Antony MaduraKelly Clarrisa Kaylor, PA-C 07/16/14 2226  Rolan BuccoMelanie Belfi, MD 07/16/14 2355

## 2014-07-16 NOTE — ED Notes (Signed)
Pt states that she fell 2 months ago and thought she was fine; pt c/o bilateral knee pain; pt able to ambulate with a steady gait; pt states that the pain has gotten progressively worse since the fall; no obvious swelling, bruising or deformity noted

## 2014-07-27 ENCOUNTER — Emergency Department (INDEPENDENT_AMBULATORY_CARE_PROVIDER_SITE_OTHER)
Admission: EM | Admit: 2014-07-27 | Discharge: 2014-07-27 | Disposition: A | Discharge status: Home / Self Care | Payer: Commercial Managed Care - PPO | Source: Home / Self Care | Attending: Family Medicine | Admitting: Family Medicine

## 2014-07-27 ENCOUNTER — Encounter (HOSPITAL_COMMUNITY): Payer: Self-pay | Admitting: *Deleted

## 2014-07-27 DIAGNOSIS — N390 Urinary tract infection, site not specified: Secondary | ICD-10-CM

## 2014-07-27 LAB — POCT URINALYSIS DIP (DEVICE)
BILIRUBIN URINE: NEGATIVE
Glucose, UA: NEGATIVE mg/dL
Ketones, ur: NEGATIVE mg/dL
NITRITE: NEGATIVE
PH: 6 (ref 5.0–8.0)
Protein, ur: NEGATIVE mg/dL
Specific Gravity, Urine: >=1.03 (ref 1.005–1.030)
Urobilinogen, UA: 0.2 mg/dL (ref 0.0–1.0)

## 2014-07-27 LAB — POCT PREGNANCY, URINE: Preg Test, Ur: NEGATIVE

## 2014-07-27 MED ORDER — CEPHALEXIN 500 MG PO CAPS: 500.0000 mg | ORAL_CAPSULE | Freq: Two times a day (BID) | ORAL | Status: AC

## 2014-07-27 NOTE — Discharge Instructions (Signed)
Thank you for coming in today. If your belly pain worsens, or you have high fever, bad vomiting, blood in your stool or black tarry stool go to the Emergency Room.   Urinary Tract Infection Urinary tract infections (UTIs) can develop anywhere along your urinary tract. Your urinary tract is your body's drainage system for removing wastes and extra water. Your urinary tract includes two kidneys, two ureters, a bladder, and a urethra. Your kidneys are a pair of bean-shaped organs. Each kidney is about the size of your fist. They are located below your ribs, one on each side of your spine. CAUSES Infections are caused by microbes, which are microscopic organisms, including fungi, viruses, and bacteria. These organisms are so small that they can only be seen through a microscope. Bacteria are the microbes that most commonly cause UTIs. SYMPTOMS  Symptoms of UTIs may vary by age and gender of the patient and by the location of the infection. Symptoms in young women typically include a frequent and intense urge to urinate and a painful, burning feeling in the bladder or urethra during urination. Older women and men are more likely to be tired, shaky, and weak and have muscle aches and abdominal pain. A fever may mean the infection is in your kidneys. Other symptoms of a kidney infection include pain in your back or sides below the ribs, nausea, and vomiting. DIAGNOSIS To diagnose a UTI, your caregiver will ask you about your symptoms. Your caregiver also will ask to provide a urine sample. The urine sample will be tested for bacteria and white blood cells. White blood cells are made by your body to help fight infection. TREATMENT  Typically, UTIs can be treated with medication. Because most UTIs are caused by a bacterial infection, they usually can be treated with the use of antibiotics. The choice of antibiotic and length of treatment depend on your symptoms and the type of bacteria causing your  infection. HOME CARE INSTRUCTIONS  If you were prescribed antibiotics, take them exactly as your caregiver instructs you. Finish the medication even if you feel better after you have only taken some of the medication.  Drink enough water and fluids to keep your urine clear or pale yellow.  Avoid caffeine, tea, and carbonated beverages. They tend to irritate your bladder.  Empty your bladder often. Avoid holding urine for long periods of time.  Empty your bladder before and after sexual intercourse.  After a bowel movement, women should cleanse from front to back. Use each tissue only once. SEEK MEDICAL CARE IF:   You have back pain.  You develop a fever.  Your symptoms do not begin to resolve within 3 days. SEEK IMMEDIATE MEDICAL CARE IF:   You have severe back pain or lower abdominal pain.  You develop chills.  You have nausea or vomiting.  You have continued burning or discomfort with urination. MAKE SURE YOU:   Understand these instructions.  Will watch your condition.  Will get help right away if you are not doing well or get worse. Document Released: 11/12/2004 Document Revised: 08/04/2011 Document Reviewed: 03/13/2011 [employer] Patient Information 2015 [employer]. This information is not intended to replace advice given to you by your health care provider. Make sure you discuss any questions you have with your health care provider.  

## 2014-07-27 NOTE — ED Notes (Signed)
Pt is here with lower abdominal pain, nausea, frequent urination, and painful urination for 1 week. Pt denies possible exposure to STD.

## 2014-07-27 NOTE — ED Provider Notes (Signed)
Tara Roach is a 49 y.o. female who presents to Urgent Care today for abdominal pain. Patient has lower abdominal pain nausea urinary frequency and urgency and pain with urination for one week. She denies any vaginal discharge diarrhea or vomiting. She has tried some BC powder and Alka-Seltzer which have helped a little. No chest pains or palpitations. Symptoms are moderate.   Past Medical History  Diagnosis Date  . Chronic back pain   . Heart murmur    Past Surgical History  Procedure Laterality Date  . Cesarean section     History  Substance Use Topics  . Smoking status: Never Smoker   . Smokeless tobacco: Not on file  . Alcohol Use: No   ROS as above Medications: No current facility-administered medications for this encounter.   Current Outpatient Prescriptions  Medication Sig Dispense Refill  . acetaminophen (TYLENOL) 500 MG tablet Take 1,000 mg by mouth every 6 (six) hours as needed (for pain.).    Marland Kitchen cephALEXin (KEFLEX) 500 MG capsule Take 1 capsule (500 mg total) by mouth 2 (two) times daily. 14 capsule 0  . cyclobenzaprine (FLEXERIL) 10 MG tablet Take 10 mg by mouth at bedtime.     . diclofenac sodium (VOLTAREN) 1 % GEL Apply 4 g topically 4 (four) times daily. 100 g 1  . HYDROcodone-acetaminophen (NORCO/VICODIN) 5-325 MG per tablet Take 1 tablet by mouth every 4 (four) hours as needed for severe pain. 11 tablet 0  . [DISCONTINUED] fluticasone (FLONASE) 50 MCG/ACT nasal spray Place 2 sprays into both nostrils daily. (Patient not taking: Reported on 07/16/2014) 16 g 2   Allergies  Allergen Reactions  . Tramadol Shortness Of Breath    anxiety  . Reglan [Metoclopramide]     Panic attack, can't breath     Exam:  BP 114/71 mmHg  Pulse 59  Temp(Src) 98.5 F (36.9 C) (Oral)  SpO2 99%  LMP 07/02/2014 Gen: Well NAD HEENT: EOMI,  MMM Lungs: Normal work of breathing. CTABL Heart: RRR no MRG Abd: NABS, Soft. Nondistended, minimally tender pelvis. No rebound or  guarding. No CV angle tenderness to percussion. Exts: Brisk capillary refill, warm and well perfused.   Results for orders placed or performed during the hospital encounter of 07/27/14 (from the past 24 hour(s))  POCT urinalysis dip (device)     Status: Abnormal   Collection Time: 07/27/14  3:11 PM  Result Value Ref Range   Glucose, UA NEGATIVE NEGATIVE mg/dL   Bilirubin Urine NEGATIVE NEGATIVE   Ketones, ur NEGATIVE NEGATIVE mg/dL   Specific Gravity, Urine >=1.030 1.005 - 1.030   Hgb urine dipstick SMALL (A) NEGATIVE   pH 6.0 5.0 - 8.0   Protein, ur NEGATIVE NEGATIVE mg/dL   Urobilinogen, UA 0.2 0.0 - 1.0 mg/dL   Nitrite NEGATIVE NEGATIVE   Leukocytes, UA TRACE (A) NEGATIVE  Pregnancy, urine POC     Status: None   Collection Time: 07/27/14  3:15 PM  Result Value Ref Range   Preg Test, Ur NEGATIVE NEGATIVE   No results found.  Assessment and Plan: 49 y.o. female with urinary tract infection. Culture pending. Treat with Keflex.  Discussed warning signs or symptoms. Please see discharge instructions. Patient expresses understanding.  .cuescurg ent   Rodolph Bong, MD 07/27/14 [phone]

## 2014-07-29 LAB — URINE CULTURE
CULTURE: NO GROWTH
Colony Count: NO GROWTH

## 2014-09-17 ENCOUNTER — Encounter (HOSPITAL_COMMUNITY): Payer: Self-pay | Admitting: Emergency Medicine

## 2014-09-17 ENCOUNTER — Emergency Department (HOSPITAL_COMMUNITY)
Admission: EM | Admit: 2014-09-17 | Discharge: 2014-09-17 | Disposition: A | Discharge status: Home / Self Care | Payer: Commercial Managed Care - PPO | Attending: Emergency Medicine | Admitting: Emergency Medicine

## 2014-09-17 DIAGNOSIS — R011 Cardiac murmur, unspecified: Secondary | ICD-10-CM | POA: Insufficient documentation

## 2014-09-17 DIAGNOSIS — G8929 Other chronic pain: Secondary | ICD-10-CM | POA: Insufficient documentation

## 2014-09-17 DIAGNOSIS — N898 Other specified noninflammatory disorders of vagina: Secondary | ICD-10-CM | POA: Diagnosis present

## 2014-09-17 DIAGNOSIS — Z202 Contact with and (suspected) exposure to infections with a predominantly sexual mode of transmission: Secondary | ICD-10-CM | POA: Insufficient documentation

## 2014-09-17 DIAGNOSIS — Z3202 Encounter for pregnancy test, result negative: Secondary | ICD-10-CM | POA: Insufficient documentation

## 2014-09-17 DIAGNOSIS — N760 Acute vaginitis: Secondary | ICD-10-CM | POA: Diagnosis not present

## 2014-09-17 DIAGNOSIS — B9689 Other specified bacterial agents as the cause of diseases classified elsewhere: Secondary | ICD-10-CM

## 2014-09-17 DIAGNOSIS — Z711 Person with feared health complaint in whom no diagnosis is made: Secondary | ICD-10-CM

## 2014-09-17 LAB — CBC WITH DIFFERENTIAL/PLATELET
BASOS ABS: 0 10*3/uL (ref 0.0–0.1)
Basophils Relative: 0 % (ref 0–1)
EOS PCT: 1 % (ref 0–5)
Eosinophils Absolute: 0.1 10*3/uL (ref 0.0–0.7)
HCT: 41.1 % (ref 36.0–46.0)
Hemoglobin: 13.3 g/dL (ref 12.0–15.0)
LYMPHS PCT: 25 % (ref 12–46)
Lymphs Abs: 2.4 10*3/uL (ref 0.7–4.0)
MCH: 29.2 pg (ref 26.0–34.0)
MCHC: 32.4 g/dL (ref 30.0–36.0)
MCV: 90.3 fL (ref 78.0–100.0)
Monocytes Absolute: 0.5 10*3/uL (ref 0.1–1.0)
Monocytes Relative: 6 % (ref 3–12)
NEUTROS ABS: 6.5 10*3/uL (ref 1.7–7.7)
NEUTROS PCT: 68 % (ref 43–77)
PLATELETS: 231 10*3/uL (ref 150–400)
RBC: 4.55 MIL/uL (ref 3.87–5.11)
RDW: 13.1 % (ref 11.5–15.5)
WBC: 9.5 10*3/uL (ref 4.0–10.5)

## 2014-09-17 LAB — URINALYSIS, ROUTINE W REFLEX MICROSCOPIC
Bilirubin Urine: NEGATIVE
GLUCOSE, UA: NEGATIVE mg/dL
KETONES UR: NEGATIVE mg/dL
LEUKOCYTES UA: NEGATIVE
Nitrite: NEGATIVE
PROTEIN: NEGATIVE mg/dL
SPECIFIC GRAVITY, URINE: 1.014 (ref 1.005–1.030)
UROBILINOGEN UA: 1 mg/dL (ref 0.0–1.0)
pH: 6 (ref 5.0–8.0)

## 2014-09-17 LAB — BASIC METABOLIC PANEL
Anion gap: 6 (ref 5–15)
BUN: 11 mg/dL (ref 6–20)
CHLORIDE: 107 mmol/L (ref 101–111)
CO2: 25 mmol/L (ref 22–32)
Calcium: 9.1 mg/dL (ref 8.9–10.3)
Creatinine, Ser: 0.51 mg/dL (ref 0.44–1.00)
GFR calc non Af Amer: >60 mL/min (ref >60)
Glucose, Bld: 104 mg/dL — ABNORMAL HIGH (ref 65–99)
Potassium: 3.8 mmol/L (ref 3.5–5.1)
Sodium: 138 mmol/L (ref 135–145)

## 2014-09-17 LAB — WET PREP, GENITAL
TRICH WET PREP: NONE SEEN
YEAST WET PREP: NONE SEEN

## 2014-09-17 LAB — URINE MICROSCOPIC-ADD ON

## 2014-09-17 LAB — BRAIN NATRIURETIC PEPTIDE: B Natriuretic Peptide: 79.6 pg/mL (ref 0.0–100.0)

## 2014-09-17 LAB — HCG, QUANTITATIVE, PREGNANCY

## 2014-09-17 MED ORDER — LIDOCAINE HCL (PF) 1 % IJ SOLN
5.0000 mL | Freq: Once | INTRAMUSCULAR | Status: DC
  Filled 2014-09-17: qty 5

## 2014-09-17 MED ORDER — LIDOCAINE HCL (PF) 1 % IJ SOLN
5.0000 mL | Freq: Once | INTRAMUSCULAR | Status: AC
  Administered 2014-09-17: 5 mL via INTRADERMAL

## 2014-09-17 MED ORDER — METRONIDAZOLE 500 MG PO TABS: 500.0000 mg | ORAL_TABLET | Freq: Two times a day (BID) | ORAL | Status: AC

## 2014-09-17 MED ORDER — CEFTRIAXONE SODIUM 250 MG IJ SOLR
250.0000 mg | Freq: Once | INTRAMUSCULAR | Status: AC
  Administered 2014-09-17: 250 mg via INTRAMUSCULAR
  Filled 2014-09-17: qty 250

## 2014-09-17 MED ORDER — ACETAMINOPHEN 325 MG PO TABS
650.0000 mg | ORAL_TABLET | Freq: Once | ORAL | Status: DC
  Filled 2014-09-17: qty 2

## 2014-09-17 MED ORDER — AZITHROMYCIN 250 MG PO TABS
1000.0000 mg | ORAL_TABLET | Freq: Once | ORAL | Status: AC
  Administered 2014-09-17: 1000 mg via ORAL
  Filled 2014-09-17: qty 4

## 2014-09-17 NOTE — ED Notes (Signed)
Patient states she doesn't feel as if she can urinate.

## 2014-09-17 NOTE — Discharge Instructions (Signed)
Bacterial Vaginosis °Bacterial vaginosis is a vaginal infection that occurs when the normal balance of bacteria in the vagina is disrupted. It results from an overgrowth of certain bacteria. This is the most common vaginal infection in women of childbearing age. Treatment is important to prevent complications, especially in pregnant women, as it can cause a premature delivery. °CAUSES  °Bacterial vaginosis is caused by an increase in harmful bacteria that are normally present in smaller amounts in the vagina. Several different kinds of bacteria can cause bacterial vaginosis. However, the reason that the condition develops is not fully understood. °RISK FACTORS °Certain activities or behaviors can put you at an increased risk of developing bacterial vaginosis, including: °· Having a new sex partner or multiple sex partners. °· Douching. °· Using an intrauterine device (IUD) for contraception. °Women do not get bacterial vaginosis from toilet seats, bedding, swimming pools, or contact with objects around them. °SIGNS AND SYMPTOMS  °Some women with bacterial vaginosis have no signs or symptoms. Common symptoms include: °· Grey vaginal discharge. °· A fishlike odor with discharge, especially after sexual intercourse. °· Itching or burning of the vagina and vulva. °· Burning or pain with urination. °DIAGNOSIS  °Your health care provider will take a medical history and examine the vagina for signs of bacterial vaginosis. A sample of vaginal fluid may be taken. Your health care provider will look at this sample under a microscope to check for bacteria and abnormal cells. A vaginal pH test may also be done.  °TREATMENT  °Bacterial vaginosis may be treated with antibiotic medicines. These may be given in the form of a pill or a vaginal cream. A second round of antibiotics may be prescribed if the condition comes back after treatment.  °HOME CARE INSTRUCTIONS  °· Only take over-the-counter or prescription medicines as  directed by your health care provider. °· If antibiotic medicine was prescribed, take it as directed. Make sure you finish it even if you start to feel better. °· Do not have sex until treatment is completed. °· Tell all sexual partners that you have a vaginal infection. They should see their health care provider and be treated if they have problems, such as a mild rash or itching. °· Practice safe sex by using condoms and only having one sex partner. °SEEK MEDICAL CARE IF:  °· Your symptoms are not improving after 3 days of treatment. °· You have increased discharge or pain. °· You have a fever. °MAKE SURE YOU:  °· Understand these instructions. °· Will watch your condition. °· Will get help right away if you are not doing well or get worse. °FOR MORE INFORMATION  °Centers for Disease Control and Prevention, Division of STD Prevention: www.cdc.gov/std °American Sexual Health Association (ASHA): www.ashastd.org  °Document Released: 02/02/2005 Document Revised: 11/23/2012 Document Reviewed: 09/14/2012 °ExitCare® Patient Information ©2015 [employer]. This information is not intended to replace advice given to you by your health care provider. Make sure you discuss any questions you have with your health care provider. ° °Sexually Transmitted Disease °A sexually transmitted disease (STD) is a disease or infection that may be passed (transmitted) from person to person, usually during sexual activity. This may happen by way of saliva, semen, blood, vaginal mucus, or urine. Common STDs include:  °· Gonorrhea.   °· Chlamydia.   °· Syphilis.   °· HIV and AIDS.   °· Genital herpes.   °· Hepatitis B and C.   °· Trichomonas.   °· Human papillomavirus (HPV).   °· Pubic lice.   °· Scabies. °· Mites. °·   Bacterial vaginosis. °WHAT ARE CAUSES OF STDs? °An STD may be caused by bacteria, a virus, or parasites. STDs are often transmitted during sexual activity if one person is infected. However, they may also be transmitted through  nonsexual means. STDs may be transmitted after:  °· Sexual intercourse with an infected person.   °· Sharing sex toys with an infected person.   °· Sharing needles with an infected person or using unclean piercing or tattoo needles. °· Having intimate contact with the genitals, mouth, or rectal areas of an infected person.   °· Exposure to infected fluids during birth. °WHAT ARE THE SIGNS AND SYMPTOMS OF STDs? °Different STDs have different symptoms. Some people may not have any symptoms. If symptoms are present, they may include:  °· Painful or bloody urination.   °· Pain in the pelvis, abdomen, vagina, anus, throat, or eyes.   °· A skin rash, itching, or irritation. °· Growths, ulcerations, blisters, or sores in the genital and anal areas. °· Abnormal vaginal discharge with or without bad odor.   °· Penile discharge in men.   °· Fever.   °· Pain or bleeding during sexual intercourse.   °· Swollen glands in the groin area.   °· Yellow skin and eyes (jaundice). This is seen with hepatitis.   °· Swollen testicles. °· Infertility. °· Sores and blisters in the mouth. °HOW ARE STDs DIAGNOSED? °To make a diagnosis, your health care provider may:  °· Take a medical history.   °· Perform a physical exam.   °· Take a sample of any discharge to examine. °· Swab the throat, cervix, opening to the penis, rectum, or vagina for testing. °· Test a sample of your first morning urine.   °· Perform blood tests.   °· Perform a Pap test, if this applies.   °· Perform a colposcopy.   °· Perform a laparoscopy.   °HOW ARE STDs TREATED? ° Treatment depends on the STD. Some STDs may be treated but not cured.  °· Chlamydia, gonorrhea, trichomonas, and syphilis can be cured with antibiotic medicine.   °· Genital herpes, hepatitis, and HIV can be treated, but not cured, with prescribed medicines. The medicines lessen symptoms.   °· Genital warts from HPV can be treated with medicine or by freezing, burning (electrocautery), or surgery. Warts  may come back.   °· HPV cannot be cured with medicine or surgery. However, abnormal areas may be removed from the cervix, vagina, or vulva.   °· If your diagnosis is confirmed, your recent sexual partners need treatment. This is true even if they are symptom-free or have a negative culture or evaluation. They should not have sex until their health care providers say it is okay. °HOW CAN I REDUCE MY RISK OF GETTING AN STD? °Take these steps to reduce your risk of getting an STD: °· Use latex condoms, dental dams, and water-soluble lubricants during sexual activity. Do not use petroleum jelly or oils. °· Avoid having multiple sex partners. °· Do not have sex with someone who has other sex partners. °· Do not have sex with anyone you do not know or who is at high risk for an STD. °· Avoid risky sex practices that can break your skin. °· Do not have sex if you have open sores on your mouth or skin. °· Avoid drinking too much alcohol or taking illegal drugs. Alcohol and drugs can affect your judgment and put you in a vulnerable position. °· Avoid engaging in oral and anal sex acts. °· Get vaccinated for HPV and hepatitis. If you have not received these vaccines in the past, talk to your   health care provider about whether one or both might be right for you.   °· If you are at risk of being infected with HIV, it is recommended that you take a prescription medicine daily to prevent HIV infection. This is called pre-exposure prophylaxis (PrEP). You are considered at risk if: °¨ You are a man who has sex with other men (MSM). °¨ You are a heterosexual man or woman and are sexually active with more than one partner. °¨ You take drugs by injection. °¨ You are sexually active with a partner who has HIV. °· Talk with your health care provider about whether you are at high risk of being infected with HIV. If you choose to begin PrEP, you should first be tested for HIV. You should then be tested every 3 months for as long as you  are taking PrEP.   °WHAT SHOULD I DO IF I THINK I HAVE AN STD? °· See your health care provider.   °· Tell your sexual partner(s). They should be tested and treated for any STDs. °· Do not have sex until your health care provider says it is okay.  °WHEN SHOULD I GET IMMEDIATE MEDICAL CARE? °Contact your health care provider right away if:  °· You have severe abdominal pain. °· You are a man and notice swelling or pain in your testicles. °· You are a woman and notice swelling or pain in your vagina. °Document Released: 04/25/2002 Document Revised: 02/07/2013 Document Reviewed: 08/23/2012 °ExitCare® Patient Information ©2015 [employer]. This information is not intended to replace advice given to you by your health care provider. Make sure you discuss any questions you have with your health care provider. ° °

## 2014-09-17 NOTE — ED Notes (Signed)
Patient unable to urinate

## 2014-09-17 NOTE — ED Provider Notes (Signed)
CSN: [id number]     Arrival date & time 09/17/14  1515 History   First MD Initiated Contact with Patient 09/17/14 1654     Chief Complaint  Patient presents with  . Vaginal Discharge  . std check    Tara Roach is a 49 y.o. female who presents to the ED complaining of vaginal discharge for the past week and requesting STD testing. The patient reports she is sexually active with one partner and does not use protection. She reports vaginal discharge last week. She denies any vaginal bleeding. She reports dysuria and increased frequency of urination. She reports her last menstrual cycle was 08/16/2014 and she reports she has mental cycles every 6 weeks. She reports having some nausea earlier today but none currently. She denies any vomiting. The patient denies fevers, chills, abdominal pain, vaginal bleeding, vomiting, chest pain, new back pain, shortness of breath, cough, wheezing, urinary urgency, hematuria, or rashes. Patient reports a history of gonorrhea in the past.  (Consider location/radiation/quality/duration/timing/severity/associated sxs/prior Treatment) HPI  Past Medical History  Diagnosis Date  . Chronic back pain   . Heart murmur    Past Surgical History  Procedure Laterality Date  . Cesarean section     Family History  Problem Relation Age of Onset  . Cancer Father   . Hypertension Sister   . Diabetes Brother    History  Substance Use Topics  . Smoking status: Never Smoker   . Smokeless tobacco: Not on file  . Alcohol Use: No   OB History    No data available     Review of Systems  Constitutional: Negative for fever and chills.  HENT: Negative for congestion and sore throat.   Eyes: Negative for visual disturbance.  Respiratory: Negative for cough, shortness of breath and wheezing.   Cardiovascular: Negative for chest pain.  Gastrointestinal: Positive for nausea. Negative for vomiting, abdominal pain, diarrhea and blood in stool.  Genitourinary: Positive  for dysuria, frequency and vaginal discharge. Negative for urgency, hematuria, flank pain, decreased urine volume, vaginal bleeding, difficulty urinating and genital sores.  Musculoskeletal: Negative for back pain and neck pain.  Skin: Negative for rash.  Neurological: Negative for light-headedness and headaches.      Allergies  Tramadol and Reglan  Home Medications   Prior to Admission medications   Medication Sig Start Date End Date Taking? Authorizing Provider  acetaminophen (TYLENOL) 500 MG tablet Take 1,000 mg by mouth every 6 (six) hours as needed (for pain.).   Yes Historical Provider, MD  cyclobenzaprine (FLEXERIL) 10 MG tablet Take 10 mg by mouth at bedtime.    Yes Historical Provider, MD  naproxen sodium (ANAPROX) 220 MG tablet Take 440 mg by mouth 2 (two) times daily as needed (pain).   Yes Historical Provider, MD  cephALEXin (KEFLEX) 500 MG capsule Take 1 capsule (500 mg total) by mouth 2 (two) times daily. Patient not taking: Reported on 09/17/2014 07/27/14   Rodolph Bong, MD  diclofenac sodium (VOLTAREN) 1 % GEL Apply 4 g topically 4 (four) times daily. Patient not taking: Reported on 09/17/2014 07/16/14   Antony Madura, PA-C  HYDROcodone-acetaminophen (NORCO/VICODIN) 5-325 MG per tablet Take 1 tablet by mouth every 4 (four) hours as needed for severe pain. Patient not taking: Reported on 09/17/2014 07/16/14   Antony Madura, PA-C  metroNIDAZOLE (FLAGYL) 500 MG tablet Take 1 tablet (500 mg total) by mouth 2 (two) times daily. 09/17/14   Everlene Farrier, PA-C   BP 123/71 mmHg  Pulse  51  Temp(Src) 98.3 F (36.8 C) (Oral)  Resp 18  SpO2 100%  LMP 09/13/2014 Physical Exam  Constitutional: She appears well-developed and well-nourished. No distress.  Nontoxic appearing.  HENT:  Head: Normocephalic and atraumatic.  Mouth/Throat: Oropharynx is clear and moist.  Eyes: Conjunctivae are normal. Pupils are equal, round, and reactive to light. Right eye exhibits no discharge. Left eye exhibits  no discharge.  Neck: Neck supple.  Cardiovascular: Normal rate, regular rhythm, normal heart sounds and intact distal pulses.  Exam reveals no gallop and no friction rub.   No murmur heard. Pulmonary/Chest: Effort normal and breath sounds normal. No respiratory distress. She has no wheezes. She has no rales.  Abdominal: Soft. Bowel sounds are normal. She exhibits no distension. There is no tenderness. There is no rebound and no guarding.  Abdomen is soft and nontender to palpation. No rebound tenderness. Negative psoas sign and obturator sign.  Genitourinary: Vaginal discharge found.  GU exam performed by me with female nurse tech chaperone. The patient has a moderate amount of white vaginal discharge. No cervical motion tenderness. No adnexal tenderness or fullness. No external lesions or lacerations noted. No vaginal bleeding.  Musculoskeletal: She exhibits no edema or tenderness.  No lower extremity edema or tenderness.  Lymphadenopathy:    She has no cervical adenopathy.  Neurological: She is alert. Coordination normal.  Skin: Skin is warm and dry. No rash noted. She is not diaphoretic. No erythema. No pallor.  Psychiatric: She has a normal mood and affect. Her behavior is normal.  Nursing note and vitals reviewed.   ED Course  Procedures (including critical care time) Labs Review Labs Reviewed  WET PREP, GENITAL - Abnormal; Notable for the following:    Clue Cells Wet Prep HPF POC MODERATE (*)    WBC, Wet Prep HPF POC FEW (*)    All other components within normal limits  URINALYSIS, ROUTINE W REFLEX MICROSCOPIC (NOT AT [facility]) - Abnormal; Notable for the following:    APPearance CLOUDY (*)    Hgb urine dipstick SMALL (*)    All other components within normal limits  BASIC METABOLIC PANEL - Abnormal; Notable for the following:    Glucose, Bld 104 (*)    All other components within normal limits  URINE MICROSCOPIC-ADD ON - Abnormal; Notable for the following:    Squamous  Epithelial / LPF FEW (*)    All other components within normal limits  HCG, QUANTITATIVE, PREGNANCY  BRAIN NATRIURETIC PEPTIDE  CBC WITH DIFFERENTIAL/PLATELET  HIV ANTIBODY (ROUTINE TESTING)  RPR  GC/CHLAMYDIA PROBE AMP ([place]) NOT AT [facility]    Imaging Review No results found.   EKG Interpretation None      Filed Vitals:   09/17/14 1524 09/17/14 1838  BP: 142/79 123/71  Pulse: 60 51  Temp: 98.3 F (36.8 C)   TempSrc: Oral   Resp: 18 18  SpO2: 100% 100%     MDM   Meds given in ED:  Medications  acetaminophen (TYLENOL) tablet 650 mg (650 mg Oral Not Given 09/17/14 1941)  cefTRIAXone (ROCEPHIN) injection 250 mg (250 mg Intramuscular Given 09/17/14 1943)  azithromycin (ZITHROMAX) tablet 1,000 mg (1,000 mg Oral Given 09/17/14 1938)  lidocaine (PF) (XYLOCAINE) 1 % injection 5 mL (5 mLs Intradermal Given 09/17/14 1944)    New Prescriptions   METRONIDAZOLE (FLAGYL) 500 MG TABLET    Take 1 tablet (500 mg total) by mouth 2 (two) times daily.    Final diagnoses:  Concern about STD in  female without diagnosis  BV (bacterial vaginosis)   This is a 49 y.o. female who presents to the ED complaining of vaginal discharge for the past week and requesting STD testing. The patient reports she is sexually active with one partner and does not use protection. She reports vaginal discharge last week. She denies any vaginal bleeding. She reports dysuria and increased frequency of urination. On exam the patient is afebrile and nontoxic appearing. Patient's abdomen is soft and nontender to palpation. On pelvic exam the patient has a moderate amount of white vaginal discharge. No cervical motion tenderness. No adnexal tenderness or fullness. Wet prep indicated moderate clue cells and few white blood cells. BMP is unremarkable. CBC is within normal limits. Urine is nitrite and leukocyte negative. She is a negative hCG. Gonorrhea, chlamydia, HIV and RPR are pending. Patient empirically treated for  gonorrhea and chlamydia with Rocephin and azithromycin. Patient discharged with prescription for Flagyl for bacterial vaginosis. I provided education on STD testing and prevention. I encouraged her to follow-up with her testing results. I advised the patient to follow-up with their primary care provider this week. I advised the patient to return to the emergency department with new or worsening symptoms or new concerns. The patient verbalized understanding and agreement with plan.      Everlene Farrier, PA-C 09/17/14 2006  Alvira Monday, MD 09/18/14 1321

## 2014-09-17 NOTE — ED Notes (Signed)
Per pt, states vaginal discharge for about a week-wants a STD check

## 2014-09-18 LAB — GC/CHLAMYDIA PROBE AMP (~~LOC~~) NOT AT ARMC
CHLAMYDIA, DNA PROBE: NEGATIVE
NEISSERIA GONORRHEA: NEGATIVE

## 2014-09-18 LAB — HIV ANTIBODY (ROUTINE TESTING W REFLEX): HIV Screen 4th Generation wRfx: NONREACTIVE

## 2014-09-18 LAB — RPR: RPR: NONREACTIVE

## 2015-08-07 IMAGING — CR DG LUMBAR SPINE COMPLETE 4+V
5 series · 5 of 5 positions shown · non-contrast
Comparison: None.

CLINICAL DATA: Patient status post fall at work.  Right-sided pain.

EXAM:
LUMBAR SPINE - COMPLETE 4+ VIEW

[t lumbar spine ap]
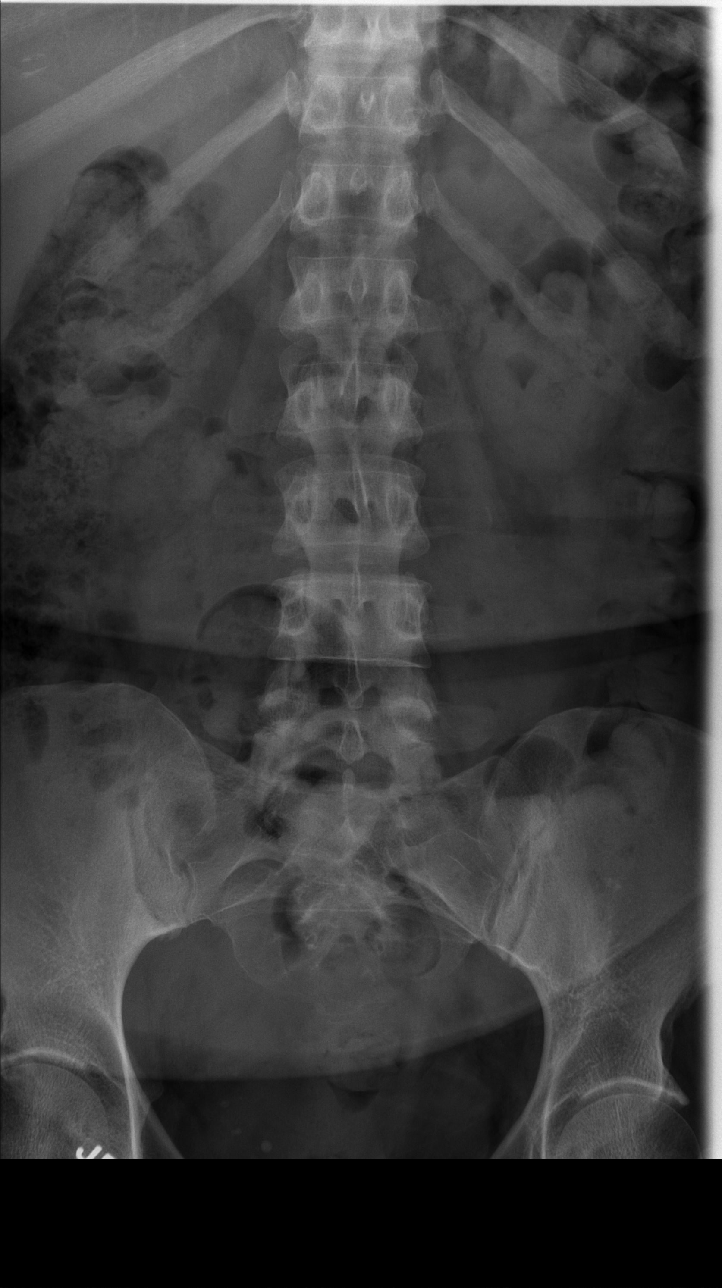

[t lumbar spine obl (1 of 2)]
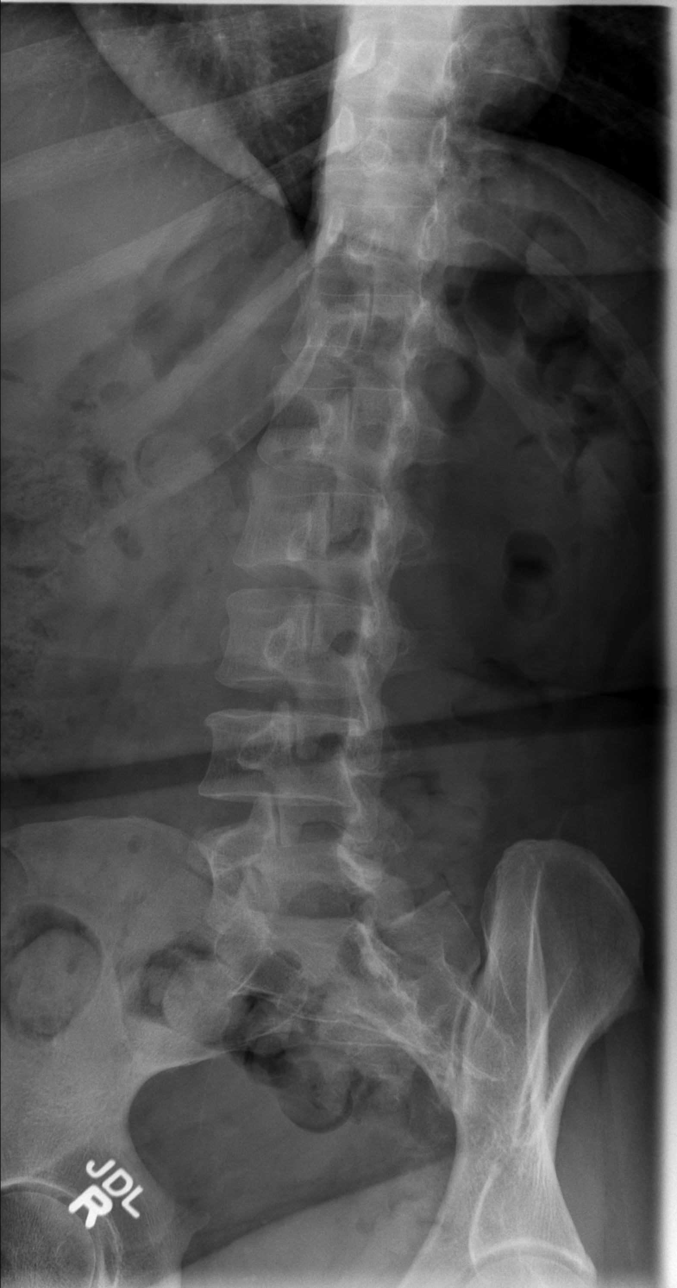

[t lumbar spine obl (2 of 2)]
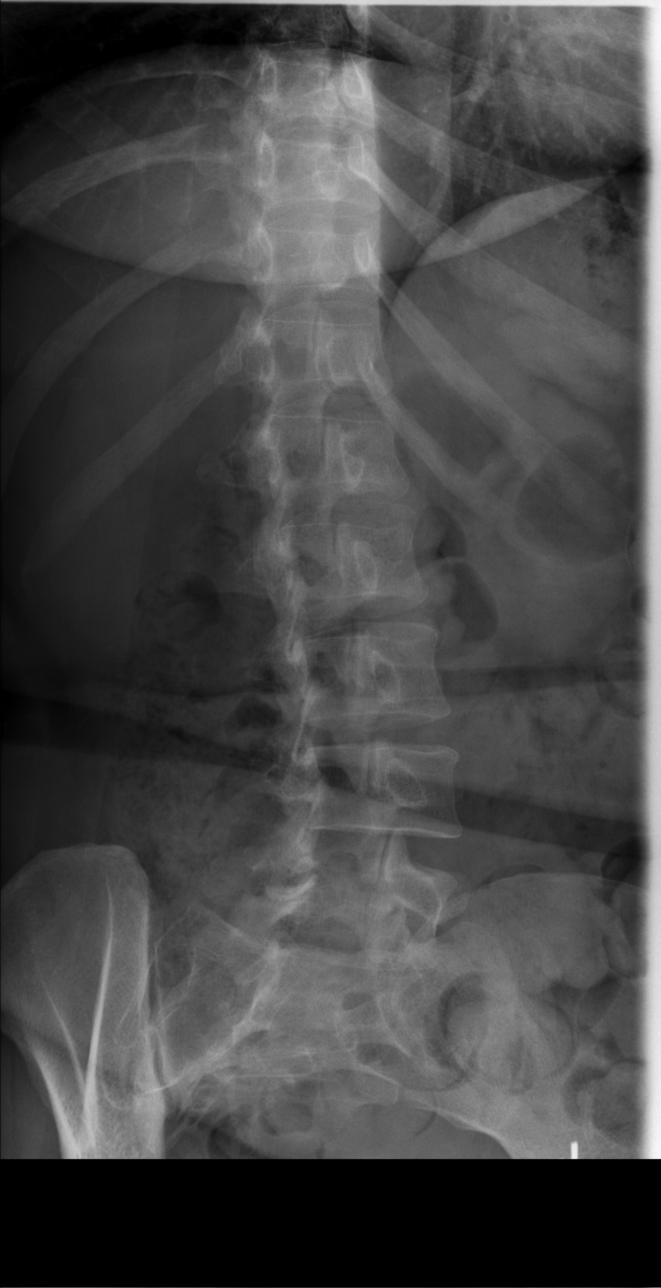

[t lumbar spine lat]
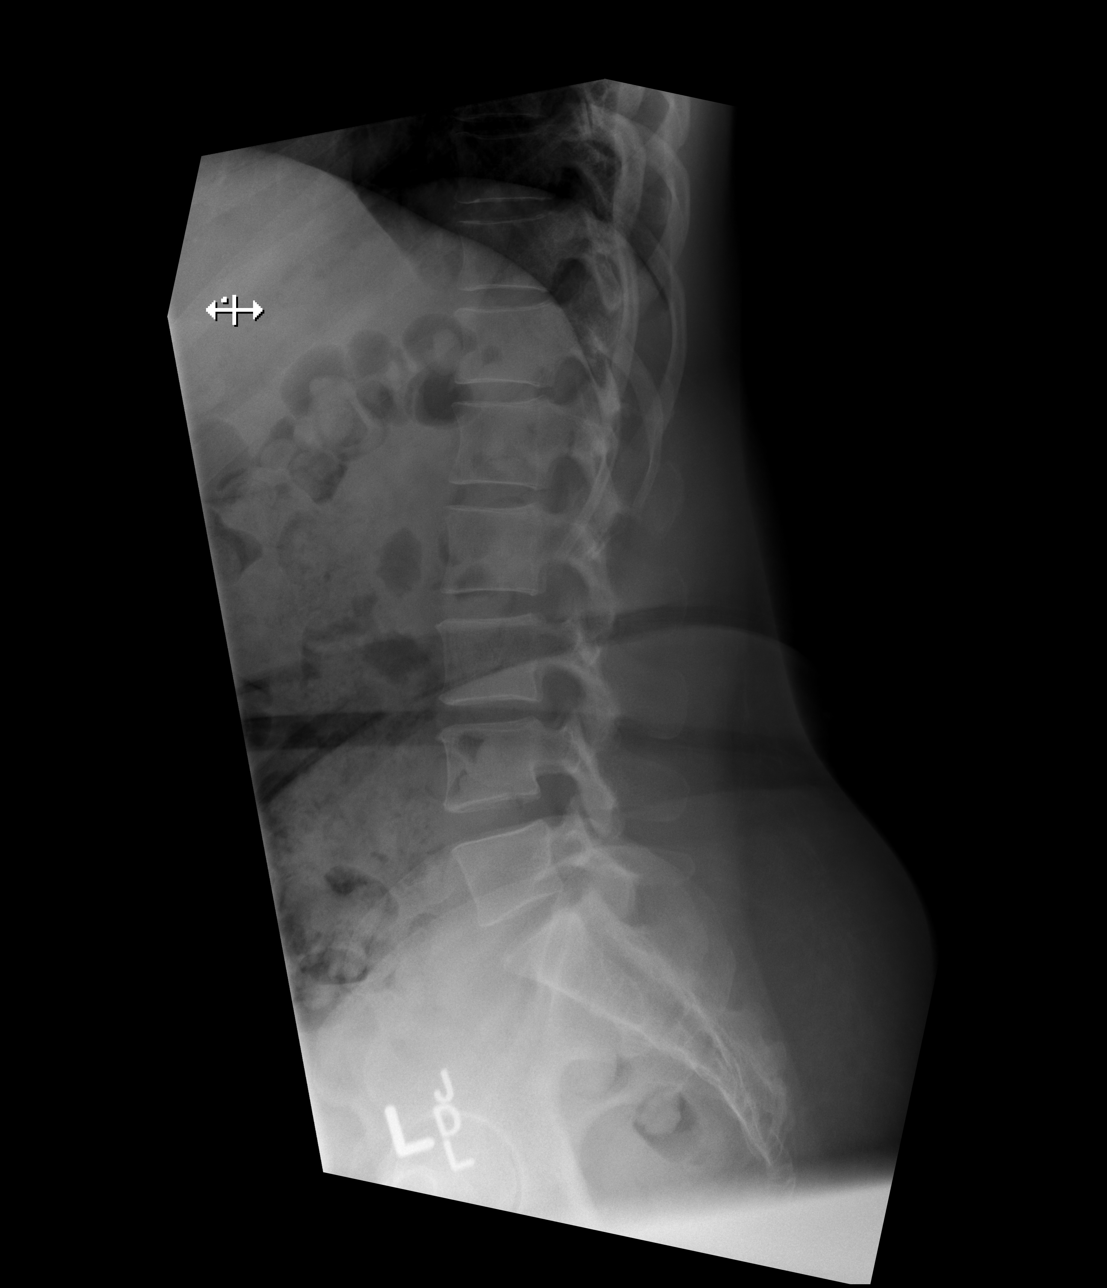

[t lumbar l-5 s-1 spot]
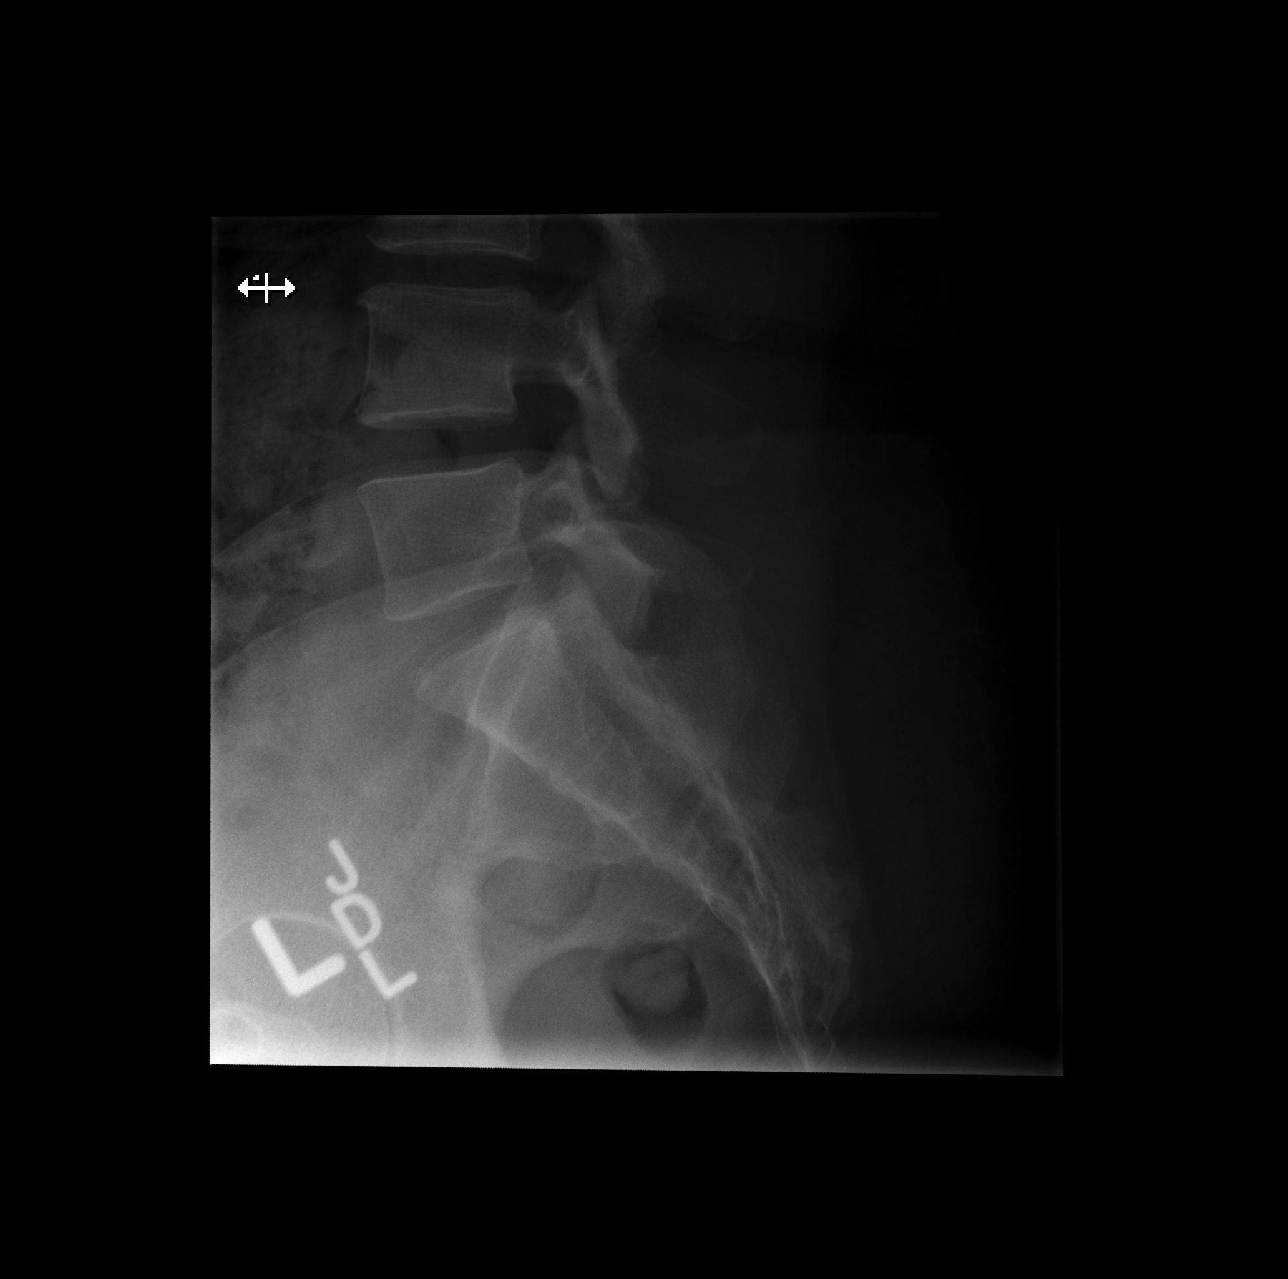

[5 of 5 positions shown; findings below may reference images not displayed]

FINDINGS: Normal anatomic alignment. No evidence for acute fracture or
dislocation. L3-4 degenerative disc disease. Preservation of the
vertebral body heights. SI joints are unremarkable. Pelvic
phleboliths.
IMPRESSION: L3-4 degenerative disc disease.

No acute osseous abnormality.
